# Patient Record
Sex: Male | Born: 1950 | Race: Black or African American | Hispanic: No | Marital: Married | Smoking: Never smoker
Health system: Southern US, Community
[De-identification: ages and names within clinical notes are randomized; demographics above are authoritative.]

## PROBLEM LIST (undated history)

## (undated) DIAGNOSIS — Z86010 Personal history of colonic polyps: Secondary | ICD-10-CM

## (undated) DIAGNOSIS — J309 Allergic rhinitis, unspecified: Secondary | ICD-10-CM

## (undated) DIAGNOSIS — K219 Gastro-esophageal reflux disease without esophagitis: Secondary | ICD-10-CM

## (undated) DIAGNOSIS — L509 Urticaria, unspecified: Secondary | ICD-10-CM

## (undated) DIAGNOSIS — R21 Rash and other nonspecific skin eruption: Secondary | ICD-10-CM

## (undated) DIAGNOSIS — R011 Cardiac murmur, unspecified: Secondary | ICD-10-CM

## (undated) DIAGNOSIS — Z6836 Body mass index (BMI) 36.0-36.9, adult: Secondary | ICD-10-CM

## (undated) DIAGNOSIS — E669 Obesity, unspecified: Secondary | ICD-10-CM

## (undated) DIAGNOSIS — T783XXA Angioneurotic edema, initial encounter: Secondary | ICD-10-CM

## (undated) DIAGNOSIS — R439 Unspecified disturbances of smell and taste: Secondary | ICD-10-CM

## (undated) DIAGNOSIS — I10 Essential (primary) hypertension: Secondary | ICD-10-CM

## (undated) DIAGNOSIS — Z87442 Personal history of urinary calculi: Secondary | ICD-10-CM

## (undated) HISTORY — DX: Rash and other nonspecific skin eruption: R21

## (undated) HISTORY — DX: Angioneurotic edema, initial encounter: T78.3XXA

## (undated) HISTORY — DX: Unspecified disturbances of smell and taste: R43.9

## (undated) HISTORY — DX: Body mass index (BMI) 36.0-36.9, adult: Z68.36

## (undated) HISTORY — DX: Cardiac murmur, unspecified: R01.1

## (undated) HISTORY — DX: Gastro-esophageal reflux disease without esophagitis: K21.9

## (undated) HISTORY — DX: Allergic rhinitis, unspecified: J30.9

## (undated) HISTORY — DX: Urticaria, unspecified: L50.9

## (undated) HISTORY — DX: Obesity, unspecified: E66.9

## (undated) HISTORY — DX: Personal history of colonic polyps: Z86.010

---

## 2013-11-07 ENCOUNTER — Other Ambulatory Visit (HOSPITAL_COMMUNITY): Payer: Self-pay | Admitting: Urology

## 2013-11-07 DIAGNOSIS — N5089 Other specified disorders of the male genital organs: Secondary | ICD-10-CM

## 2013-11-21 ENCOUNTER — Ambulatory Visit (HOSPITAL_COMMUNITY)
Admission: RE | Admit: 2013-11-21 | Discharge: 2013-11-21 | Disposition: A | Discharge status: Home / Self Care | Source: Ambulatory Visit | Attending: Urology | Admitting: Urology

## 2013-11-21 DIAGNOSIS — N508 Other specified disorders of male genital organs: Secondary | ICD-10-CM | POA: Insufficient documentation

## 2013-11-21 DIAGNOSIS — N5089 Other specified disorders of the male genital organs: Secondary | ICD-10-CM

## 2013-11-21 DIAGNOSIS — N433 Hydrocele, unspecified: Secondary | ICD-10-CM | POA: Insufficient documentation

## 2013-12-12 ENCOUNTER — Emergency Department (HOSPITAL_COMMUNITY)

## 2013-12-12 ENCOUNTER — Encounter (HOSPITAL_COMMUNITY): Payer: Self-pay | Admitting: Emergency Medicine

## 2013-12-12 ENCOUNTER — Emergency Department (HOSPITAL_COMMUNITY)
Admission: EM | Admit: 2013-12-12 | Discharge: 2013-12-12 | Disposition: A | Discharge status: Home / Self Care | Attending: Emergency Medicine | Admitting: Emergency Medicine

## 2013-12-12 DIAGNOSIS — G479 Sleep disorder, unspecified: Secondary | ICD-10-CM | POA: Diagnosis not present

## 2013-12-12 DIAGNOSIS — Z7982 Long term (current) use of aspirin: Secondary | ICD-10-CM | POA: Diagnosis not present

## 2013-12-12 DIAGNOSIS — R52 Pain, unspecified: Secondary | ICD-10-CM | POA: Insufficient documentation

## 2013-12-12 DIAGNOSIS — M79609 Pain in unspecified limb: Secondary | ICD-10-CM | POA: Insufficient documentation

## 2013-12-12 DIAGNOSIS — M25519 Pain in unspecified shoulder: Secondary | ICD-10-CM | POA: Diagnosis not present

## 2013-12-12 DIAGNOSIS — Z79899 Other long term (current) drug therapy: Secondary | ICD-10-CM | POA: Insufficient documentation

## 2013-12-12 DIAGNOSIS — M25512 Pain in left shoulder: Secondary | ICD-10-CM

## 2013-12-12 DIAGNOSIS — I10 Essential (primary) hypertension: Secondary | ICD-10-CM | POA: Insufficient documentation

## 2013-12-12 HISTORY — DX: Essential (primary) hypertension: I10

## 2013-12-12 MED ORDER — OXYCODONE-ACETAMINOPHEN 5-325 MG PO TABS: 1.0000 | ORAL_TABLET | Freq: Four times a day (QID) | ORAL | Status: DC | PRN

## 2013-12-12 MED ORDER — OXYCODONE-ACETAMINOPHEN 5-325 MG PO TABS
1.0000 | ORAL_TABLET | Freq: Once | ORAL | Status: AC
  Administered 2013-12-12: 1 via ORAL
  Filled 2013-12-12: qty 1

## 2013-12-12 NOTE — ED Notes (Signed)
MD at bedside. 

## 2013-12-12 NOTE — Discharge Instructions (Signed)
Return to the ED with any concerns including chest pain, shortness of breath, swelling of arm, weakness of arm, or any other alarming symptoms

## 2013-12-12 NOTE — ED Provider Notes (Signed)
CSN: [id number]     Arrival date & time 12/12/13  0137 History   First MD Initiated Contact with Patient 12/12/13 [phone]     Chief Complaint  Patient presents with  . Arm Pain     (Consider location/radiation/quality/duration/timing/severity/associated sxs/prior Treatment) HPI Pt presents with c/o pain in left shoulder.  He states pain is worse with movement and palpation and was causing him difficulty sleeping tonight.  Pt states the pain has been present for 3 days.  No significant injury or trauma.  Pt states he tried aspirin which did not help with pain.  Pain is worse with lifting arm over head.  No swelling.  No weakness of arm.  No neck pain.  No chest pain or shortness of breath.  There are no other associated systemic symptoms, there are no other alleviating or modifying factors.   Past Medical History  Diagnosis Date  . Hypertension    History reviewed. No pertinent past surgical history. History reviewed. No pertinent family history. History  Substance Use Topics  . Smoking status: Never Smoker   . Smokeless tobacco: Not on file  . Alcohol Use: No    Review of Systems ROS reviewed and all otherwise negative except for mentioned in HPI    Allergies  Contrast media  Home Medications   Prior to Admission medications   Medication Sig Start Date End Date Taking? Authorizing Provider  aspirin 325 MG tablet Take 325 mg by mouth every 4 (four) hours as needed for moderate pain.   Yes Historical Provider, MD  aspirin EC 81 MG tablet Take 81 mg by mouth daily.   Yes Historical Provider, MD  atenolol (TENORMIN) 50 MG tablet Take 50 mg by mouth daily.   Yes Historical Provider, MD  esomeprazole (NEXIUM) 20 MG capsule Take 20 mg by mouth daily at 12 noon.   Yes Historical Provider, MD  glucosamine-chondroitin 500-400 MG tablet Take 1 tablet by mouth 3 (three) times daily.   Yes Historical Provider, MD  lisinopril (PRINIVIL,ZESTRIL) 20 MG tablet Take 20 mg by mouth daily.   Yes  Historical Provider, MD  tadalafil (CIALIS) 5 MG tablet Take 5 mg by mouth daily as needed for erectile dysfunction.   Yes Historical Provider, MD  oxyCODONE-acetaminophen (PERCOCET/ROXICET) 5-325 MG per tablet Take 1-2 tablets by mouth every 6 (six) hours as needed for severe pain. 12/12/13   Threasa Beards, MD   BP 132/78  Pulse 76  Temp(Src) 97.7 F (36.5 C) (Oral)  Resp 16  SpO2 100% Vitals reviewed Physical Exam Physical Examination: General appearance - alert, well appearing, and in no distress Mental status - alert, oriented to person, place, and time Eyes - no conjunctival injection, no scleral icterus Mouth - mucous membranes moist, pharynx normal without lesions Neck - supple, no significant adenopathy Chest - clear to auscultation, no wheezes, rales or rhonchi, symmetric air entry Heart - normal rate, regular rhythm, normal S1, S2, no murmurs, rubs, clicks or gallops Musculoskeletal - no bony point tenderness, but pain in left shoulder with raising arm above 90 degrees, also pain with rotation of left shoulder, no joint tenderness, deformity or swelling Extremities - peripheral pulses normal, no pedal edema, no clubbing or cyanosis Skin - normal coloration and turgor, no rashes  ED Course  Procedures (including critical care time) Labs Review Labs Reviewed - No data to display  Imaging Review Dg Shoulder Left  12/12/2013   CLINICAL DATA:  Arm pain for several days. Limited movement. No known  injury.  EXAM: LEFT SHOULDER - 2+ VIEW  COMPARISON:  None.  FINDINGS: There is no evidence of fracture or dislocation. There is no evidence of arthropathy or other focal bone abnormality. Soft tissues are unremarkable.  IMPRESSION: Negative.   Electronically Signed   By: Lucienne Capers M.D.   On: 12/12/2013 04:08     EKG Interpretation   Date/Time:  Monday December 12 2013 04:07:19 EDT Ventricular Rate:  81 PR Interval:  190 QRS Duration: 98 QT Interval:  383 QTC Calculation:  445 R Axis:   -6 Text Interpretation:  Sinus rhythm Abnormal R-wave progression, early  transition Left ventricular hypertrophy No old tracing to compare  Confirmed by [facility]  MD, [place] [phone]) on 12/12/2013 4:12:01 AM      MDM   Final diagnoses:  Shoulder pain, acute, left    Pt presenting with c/o left shoulder pain which has been ongoing for 3 days.  Pain is musculoskeletal in nature- worse with movement and palpation.  EKG without any acute changes- done as a screening, although doubt ACS due to no chest discomfort and muscular nature of pain on exam.  Pt also states that he had a stress test several months ago.  Discharged with strict return precautions.  Pt agreeable with plan.    Threasa Beards, MD 12/12/13 [phone]

## 2013-12-12 NOTE — ED Notes (Signed)
Patient transported to X-ray 

## 2013-12-12 NOTE — ED Notes (Signed)
Pt arrived to the ED with a complaint of left arm pain. Pt states that he has had the pain for three days.  Today the pain has not subsided and is unrelieved by taking asprin.  Pt has full range of motion but it is painful to do so.

## 2015-02-07 DIAGNOSIS — N138 Other obstructive and reflux uropathy: Secondary | ICD-10-CM | POA: Diagnosis not present

## 2015-02-07 DIAGNOSIS — N401 Enlarged prostate with lower urinary tract symptoms: Secondary | ICD-10-CM | POA: Diagnosis not present

## 2015-02-07 DIAGNOSIS — N5201 Erectile dysfunction due to arterial insufficiency: Secondary | ICD-10-CM | POA: Diagnosis not present

## 2015-02-07 DIAGNOSIS — R3912 Poor urinary stream: Secondary | ICD-10-CM | POA: Diagnosis not present

## 2015-05-24 DIAGNOSIS — I10 Essential (primary) hypertension: Secondary | ICD-10-CM | POA: Diagnosis not present

## 2015-05-24 DIAGNOSIS — Z0000 Encounter for general adult medical examination without abnormal findings: Secondary | ICD-10-CM | POA: Diagnosis not present

## 2015-05-24 DIAGNOSIS — Z23 Encounter for immunization: Secondary | ICD-10-CM | POA: Diagnosis not present

## 2015-05-24 DIAGNOSIS — Z136 Encounter for screening for cardiovascular disorders: Secondary | ICD-10-CM | POA: Diagnosis not present

## 2015-05-24 DIAGNOSIS — K219 Gastro-esophageal reflux disease without esophagitis: Secondary | ICD-10-CM | POA: Diagnosis not present

## 2015-05-24 DIAGNOSIS — Z125 Encounter for screening for malignant neoplasm of prostate: Secondary | ICD-10-CM | POA: Diagnosis not present

## 2015-05-24 DIAGNOSIS — Z13220 Encounter for screening for lipoid disorders: Secondary | ICD-10-CM | POA: Diagnosis not present

## 2015-05-24 DIAGNOSIS — K625 Hemorrhage of anus and rectum: Secondary | ICD-10-CM | POA: Diagnosis not present

## 2015-06-07 DIAGNOSIS — K625 Hemorrhage of anus and rectum: Secondary | ICD-10-CM | POA: Diagnosis not present

## 2015-06-07 DIAGNOSIS — K219 Gastro-esophageal reflux disease without esophagitis: Secondary | ICD-10-CM | POA: Diagnosis not present

## 2016-03-19 DIAGNOSIS — K648 Other hemorrhoids: Secondary | ICD-10-CM | POA: Diagnosis not present

## 2016-03-19 DIAGNOSIS — K635 Polyp of colon: Secondary | ICD-10-CM | POA: Diagnosis not present

## 2016-03-19 DIAGNOSIS — D123 Benign neoplasm of transverse colon: Secondary | ICD-10-CM | POA: Diagnosis not present

## 2016-03-19 DIAGNOSIS — K5730 Diverticulosis of large intestine without perforation or abscess without bleeding: Secondary | ICD-10-CM | POA: Diagnosis not present

## 2016-03-19 DIAGNOSIS — D120 Benign neoplasm of cecum: Secondary | ICD-10-CM | POA: Diagnosis not present

## 2016-03-19 DIAGNOSIS — D126 Benign neoplasm of colon, unspecified: Secondary | ICD-10-CM | POA: Diagnosis not present

## 2016-03-19 DIAGNOSIS — D125 Benign neoplasm of sigmoid colon: Secondary | ICD-10-CM | POA: Diagnosis not present

## 2016-03-19 DIAGNOSIS — R12 Heartburn: Secondary | ICD-10-CM | POA: Diagnosis not present

## 2016-03-19 DIAGNOSIS — K625 Hemorrhage of anus and rectum: Secondary | ICD-10-CM | POA: Diagnosis not present

## 2016-03-19 DIAGNOSIS — K2930 Chronic superficial gastritis without bleeding: Secondary | ICD-10-CM | POA: Diagnosis not present

## 2016-03-19 DIAGNOSIS — K3189 Other diseases of stomach and duodenum: Secondary | ICD-10-CM | POA: Diagnosis not present

## 2016-03-25 DIAGNOSIS — K635 Polyp of colon: Secondary | ICD-10-CM | POA: Diagnosis not present

## 2016-03-25 DIAGNOSIS — D126 Benign neoplasm of colon, unspecified: Secondary | ICD-10-CM | POA: Diagnosis not present

## 2016-03-25 DIAGNOSIS — K2930 Chronic superficial gastritis without bleeding: Secondary | ICD-10-CM | POA: Diagnosis not present

## 2016-04-16 DIAGNOSIS — R3912 Poor urinary stream: Secondary | ICD-10-CM | POA: Diagnosis not present

## 2016-04-16 DIAGNOSIS — N5201 Erectile dysfunction due to arterial insufficiency: Secondary | ICD-10-CM | POA: Diagnosis not present

## 2016-04-16 DIAGNOSIS — N401 Enlarged prostate with lower urinary tract symptoms: Secondary | ICD-10-CM | POA: Diagnosis not present

## 2016-06-09 DIAGNOSIS — R252 Cramp and spasm: Secondary | ICD-10-CM | POA: Diagnosis not present

## 2016-06-09 DIAGNOSIS — Z0000 Encounter for general adult medical examination without abnormal findings: Secondary | ICD-10-CM | POA: Diagnosis not present

## 2016-06-09 DIAGNOSIS — Z23 Encounter for immunization: Secondary | ICD-10-CM | POA: Diagnosis not present

## 2016-06-09 DIAGNOSIS — R21 Rash and other nonspecific skin eruption: Secondary | ICD-10-CM | POA: Diagnosis not present

## 2016-06-09 DIAGNOSIS — I10 Essential (primary) hypertension: Secondary | ICD-10-CM | POA: Diagnosis not present

## 2016-06-09 DIAGNOSIS — K219 Gastro-esophageal reflux disease without esophagitis: Secondary | ICD-10-CM | POA: Diagnosis not present

## 2016-06-09 DIAGNOSIS — Z6837 Body mass index (BMI) 37.0-37.9, adult: Secondary | ICD-10-CM | POA: Diagnosis not present

## 2017-03-21 DIAGNOSIS — R51 Headache: Secondary | ICD-10-CM | POA: Diagnosis not present

## 2017-03-21 DIAGNOSIS — M25511 Pain in right shoulder: Secondary | ICD-10-CM | POA: Diagnosis not present

## 2017-06-01 ENCOUNTER — Emergency Department (HOSPITAL_COMMUNITY)
Admission: EM | Admit: 2017-06-01 | Discharge: 2017-06-01 | Disposition: A | Discharge status: Home / Self Care | Payer: Medicare Other | Attending: Emergency Medicine | Admitting: Emergency Medicine

## 2017-06-01 ENCOUNTER — Encounter (HOSPITAL_COMMUNITY): Payer: Self-pay | Admitting: Emergency Medicine

## 2017-06-01 ENCOUNTER — Emergency Department (HOSPITAL_COMMUNITY): Payer: Medicare Other

## 2017-06-01 DIAGNOSIS — R112 Nausea with vomiting, unspecified: Secondary | ICD-10-CM | POA: Diagnosis not present

## 2017-06-01 DIAGNOSIS — N201 Calculus of ureter: Secondary | ICD-10-CM | POA: Diagnosis not present

## 2017-06-01 DIAGNOSIS — R1030 Lower abdominal pain, unspecified: Secondary | ICD-10-CM | POA: Diagnosis not present

## 2017-06-01 DIAGNOSIS — I10 Essential (primary) hypertension: Secondary | ICD-10-CM | POA: Insufficient documentation

## 2017-06-01 DIAGNOSIS — Z79899 Other long term (current) drug therapy: Secondary | ICD-10-CM | POA: Diagnosis not present

## 2017-06-01 DIAGNOSIS — N132 Hydronephrosis with renal and ureteral calculous obstruction: Secondary | ICD-10-CM | POA: Diagnosis not present

## 2017-06-01 LAB — COMPREHENSIVE METABOLIC PANEL
ALBUMIN: 4 g/dL (ref 3.5–5.0)
ALT: 29 U/L (ref 17–63)
AST: 23 U/L (ref 15–41)
Alkaline Phosphatase: 62 U/L (ref 38–126)
Anion gap: 9 (ref 5–15)
BILIRUBIN TOTAL: 0.7 mg/dL (ref 0.3–1.2)
BUN: 11 mg/dL (ref 6–20)
CO2: 23 mmol/L (ref 22–32)
Calcium: 8.8 mg/dL — ABNORMAL LOW (ref 8.9–10.3)
Chloride: 107 mmol/L (ref 101–111)
Creatinine, Ser: 0.89 mg/dL (ref 0.61–1.24)
GFR calc Af Amer: >60 mL/min (ref >60)
GFR calc non Af Amer: >60 mL/min (ref >60)
GLUCOSE: 139 mg/dL — AB (ref 65–99)
POTASSIUM: 3.9 mmol/L (ref 3.5–5.1)
Sodium: 139 mmol/L (ref 135–145)
Total Protein: 7.4 g/dL (ref 6.5–8.1)

## 2017-06-01 LAB — I-STAT CG4 LACTIC ACID, ED
Lactic Acid, Venous: 3.4 mmol/L (ref 0.5–1.9)
Lactic Acid, Venous: 2.11 mmol/L (ref 0.5–1.9)

## 2017-06-01 LAB — URINALYSIS, ROUTINE W REFLEX MICROSCOPIC
Bilirubin Urine: NEGATIVE
Glucose, UA: NEGATIVE mg/dL
Ketones, ur: NEGATIVE mg/dL
Leukocytes, UA: NEGATIVE
NITRITE: NEGATIVE
Protein, ur: 30 mg/dL — AB
SQUAMOUS EPITHELIAL / LPF: NONE SEEN
Specific Gravity, Urine: 1.021 (ref 1.005–1.030)
pH: 5 (ref 5.0–8.0)

## 2017-06-01 LAB — CBC
HEMATOCRIT: 40.3 % (ref 39.0–52.0)
Hemoglobin: 13.3 g/dL (ref 13.0–17.0)
MCH: 29.9 pg (ref 26.0–34.0)
MCHC: 33 g/dL (ref 30.0–36.0)
MCV: 90.6 fL (ref 78.0–100.0)
Platelets: 242 10*3/uL (ref 150–400)
RBC: 4.45 MIL/uL (ref 4.22–5.81)
RDW: 13.6 % (ref 11.5–15.5)
WBC: 6.3 10*3/uL (ref 4.0–10.5)

## 2017-06-01 LAB — LIPASE, BLOOD: Lipase: 21 U/L (ref 11–51)

## 2017-06-01 MED ORDER — KETOROLAC TROMETHAMINE 30 MG/ML IJ SOLN: 30.0000 mg | Freq: Once | INTRAMUSCULAR | Status: DC

## 2017-06-01 MED ORDER — OXYCODONE-ACETAMINOPHEN 5-325 MG PO TABS
1.0000 | ORAL_TABLET | Freq: Four times a day (QID) | ORAL | 0 refills | Status: DC | PRN
Start: 2017-06-01 — End: 2017-08-26

## 2017-06-01 MED ORDER — MORPHINE SULFATE (PF) 4 MG/ML IV SOLN: 4.0000 mg | Freq: Once | INTRAVENOUS | Status: DC

## 2017-06-01 MED ORDER — SODIUM CHLORIDE 0.9 % IV BOLUS (SEPSIS)
1000.0000 mL | Freq: Once | INTRAVENOUS | Status: AC
  Administered 2017-06-01: 1000 mL via INTRAVENOUS

## 2017-06-01 MED ORDER — ONDANSETRON HCL 4 MG/2ML IJ SOLN
4.0000 mg | Freq: Once | INTRAMUSCULAR | Status: AC
  Administered 2017-06-01: 4 mg via INTRAVENOUS
  Filled 2017-06-01: qty 2

## 2017-06-01 MED ORDER — ONDANSETRON 4 MG PO TBDP
4.0000 mg | ORAL_TABLET | Freq: Once | ORAL | Status: AC | PRN
  Administered 2017-06-01: 4 mg via ORAL
  Filled 2017-06-01: qty 1

## 2017-06-01 MED ORDER — PROMETHAZINE HCL 25 MG PO TABS: 25.0000 mg | ORAL_TABLET | Freq: Four times a day (QID) | ORAL | 0 refills | Status: DC | PRN

## 2017-06-01 NOTE — ED Notes (Signed)
Pt cannot use restroom at this time, aware urine specimen is needed.  

## 2017-06-01 NOTE — ED Notes (Signed)
RN stated to Probation officer that she would still be obtaining I-Stat lactic on patient

## 2017-06-01 NOTE — ED Notes (Signed)
Rn stated to Probation officer that she would obtain I-Stat lactic

## 2017-06-01 NOTE — ED Triage Notes (Signed)
Patient reports while driving down road he started having n/v. Denies any diarrhea.

## 2017-06-01 NOTE — Discharge Instructions (Signed)
Follow-up with urology as scheduled.  Return here for any worsening in your condition.  Increase your fluid intake

## 2017-06-01 NOTE — ED Provider Notes (Signed)
[place] DEPT Provider Note   CSN: [id number] Arrival date & time: 06/01/17  1136     History   Chief Complaint Chief Complaint  Patient presents with  . Emesis    HPI Mitchell Pennington is a 67 y.o. male.  HPI Patient presents to the emergency department with abdominal discomfort in the lower abdomen and nausea and vomiting that started acutely this morning.  The patient states that he did not take any medications prior to arrival for symptoms.  The patient states that nothing seems to make the condition better or worse.  The patient denies chest pain, shortness of breath, headache,blurred vision, neck pain, fever, cough, weakness, numbness, dizziness, anorexia, edema,diarrhea, rash, back pain, dysuria, hematemesis, bloody stool, near syncope, or syncope. Past Medical History:  Diagnosis Date  . Hypertension     There are no active problems to display for this patient.   History reviewed. No pertinent surgical history.     Home Medications    Prior to Admission medications   Medication Sig Start Date End Date Taking? Authorizing Provider  aspirin EC 81 MG tablet Take 81 mg by mouth daily.   Yes [provider]  atenolol (TENORMIN) 50 MG tablet Take 50 mg by mouth daily.   Yes [provider]  lisinopril (PRINIVIL,ZESTRIL) 20 MG tablet Take 20 mg by mouth daily.   Yes [provider]  pantoprazole (PROTONIX) 40 MG tablet Take 40 mg by mouth daily. 05/05/17  Yes [provider]  tadalafil (CIALIS) 5 MG tablet Take 5 mg by mouth daily as needed for erectile dysfunction.   Yes [provider]  triamcinolone cream (KENALOG) 0.1 % Apply 1 application topically daily as needed. 04/18/17  Yes [provider]  oxyCODONE-acetaminophen (PERCOCET/ROXICET) 5-325 MG per tablet Take 1-2 tablets by mouth every 6 (six) hours as needed for severe pain. Patient not taking: Reported on 06/01/2017 12/12/13    Pixie Casino, MD    Family History No family history on file.  Social History Social History   Tobacco Use  . Smoking status: Never Smoker  . Smokeless tobacco: Never Used  Substance Use Topics  . Alcohol use: No  . Drug use: No     Allergies   Contrast media [iodinated diagnostic agents]   Review of Systems Review of Systems  All other systems negative except as documented in the HPI. All pertinent positives and negatives as reviewed in the HPI. Physical Exam Updated Vital Signs BP (!) 156/88   Pulse (!) 58   Resp 20   SpO2 98%   Physical Exam  Constitutional: He is oriented to person, place, and time. He appears well-developed and well-nourished. No distress.  HENT:  Head: Normocephalic and atraumatic.  Mouth/Throat: Oropharynx is clear and moist.  Eyes: Pupils are equal, round, and reactive to light.  Neck: Normal range of motion. Neck supple.  Cardiovascular: Normal rate, regular rhythm and normal heart sounds. Exam reveals no gallop and no friction rub.  No murmur heard. Pulmonary/Chest: Effort normal and breath sounds normal. No respiratory distress. He has no wheezes.  Abdominal: Soft. Bowel sounds are normal. He exhibits no distension and no mass. There is tenderness. There is no rebound and no guarding.  Neurological: He is alert and oriented to person, place, and time. He exhibits normal muscle tone. Coordination normal.  Skin: Skin is warm and dry. Capillary refill takes less than 2 seconds. No rash noted. No erythema.  Psychiatric: He has a normal  mood and affect. His behavior is normal.  Nursing note and vitals reviewed.    ED Treatments / Results  Labs (all labs ordered are listed, but only abnormal results are displayed) Labs Reviewed  COMPREHENSIVE METABOLIC PANEL - Abnormal; Notable for the following components:      Result Value   Glucose, Bld 139 (*)    Calcium 8.8 (*)    All other components within normal limits  URINALYSIS, ROUTINE W  REFLEX MICROSCOPIC - Abnormal; Notable for the following components:   APPearance HAZY (*)    Hgb urine dipstick LARGE (*)    Protein, ur 30 (*)    Bacteria, UA RARE (*)    All other components within normal limits  I-STAT CG4 LACTIC ACID, ED - Abnormal; Notable for the following components:   Lactic Acid, Venous 3.40 (*)    All other components within normal limits  I-STAT CG4 LACTIC ACID, ED - Abnormal; Notable for the following components:   Lactic Acid, Venous 2.11 (*)    All other components within normal limits  LIPASE, BLOOD  CBC    EKG  EKG Interpretation None       Radiology Ct Abdomen Pelvis Wo Contrast  Result Date: 06/01/2017 CLINICAL DATA:  67 y/o  M; nausea and vomiting. EXAM: CT ABDOMEN AND PELVIS WITHOUT CONTRAST TECHNIQUE: Multidetector CT imaging of the abdomen and pelvis was performed following the standard protocol without IV contrast. COMPARISON:  None. FINDINGS: Lower chest: 4 mm right middle lobe juxtapleural nodule (series 4, image 20). Mild gynecomastia. Hepatobiliary: No focal liver abnormality is seen. No gallstones, gallbladder wall thickening, or biliary dilatation. Pancreas: Unremarkable. No pancreatic ductal dilatation or surrounding inflammatory changes. Spleen: Normal in size without focal abnormality. Adrenals/Urinary Tract: Bilateral renal stones measuring up to 7 mm in the right kidney lower pole and punctate in left interpolar kidney. Right kidney lower pole cyst measuring 39 mm. Mild right hydronephrosis with 4 mm stone in the right distal ureter approximately 5-6 cm upstream to the ureterovesicular junction. Normal left ureter. Normal bladder. Normal adrenal glands. Stomach/Bowel: Stomach is within normal limits. Appendix not identified. No evidence of bowel wall thickening, distention, or inflammatory changes. Vascular/Lymphatic: No significant vascular findings are present. No enlarged abdominal or pelvic lymph nodes. Reproductive: Prostate measures  4.2 x 5.1 x 4.3 cm (volume = 48 cm^3). Other: Small paraumbilical hernia containing fat. Musculoskeletal: No fracture is seen. Mild-to-moderate lumbar spondylosis with prominent lower lumbar facet arthrosis. IMPRESSION: 1. Mild right hydronephrosis with 4 mm stone in distal right ureter approximately 5-6 cm upstream to ureterovesicular junction. 2. Bilateral nonobstructing kidney stones. 3. 4 mm right middle lobe pulmonary nodule. No follow-up needed if patient is low-risk. Non-contrast chest CT can be considered in 12 months if patient is high-risk. This recommendation follows the consensus statement: Guidelines for Management of Incidental Pulmonary Nodules Detected on CT Images: From the Fleischner Society 2017; Radiology 2017; 284:228-243. Electronically Signed   By: Kristine Garbe M.D.   On: 06/01/2017 22:21    Procedures Procedures (including critical care time)  Medications Ordered in ED Medications  morphine 4 MG/ML injection 4 mg (not administered)  ketorolac (TORADOL) 30 MG/ML injection 30 mg (not administered)  ondansetron (ZOFRAN-ODT) disintegrating tablet 4 mg (4 mg Oral Given 06/01/17 1155)  sodium chloride 0.9 % bolus 1,000 mL (1,000 mLs Intravenous New Bag/Given 06/01/17 2006)  ondansetron (ZOFRAN) injection 4 mg (4 mg Intravenous Given 06/01/17 2006)     Initial Impression / Assessment and Plan / ED  Course  I have reviewed the triage vital signs and the nursing notes.  Pertinent labs & imaging results that were available during my care of the patient were reviewed by me and considered in my medical decision making (see chart for details).   Patient is given a liter of fluid here in the emergency department and is feeling no pain at this time.  Patient does have a 4 mm right ureteral stone.  He does have an appointment with a urologist on Thursday for separate issue.  She states he passed a kidney stone probably 30-40 years ago.  Patient is advised to return here for any  worsening in his condition patient agrees the plan and all questions were answered.     Final Clinical Impressions(s) / ED Diagnoses   Final diagnoses:  None    ED Discharge Orders    None       Dalia Heading, PA-C 06/01/17 2257    Gareth Morgan, MD 06/02/17 1214

## 2017-06-04 DIAGNOSIS — N5201 Erectile dysfunction due to arterial insufficiency: Secondary | ICD-10-CM | POA: Diagnosis not present

## 2017-06-04 DIAGNOSIS — N201 Calculus of ureter: Secondary | ICD-10-CM | POA: Diagnosis not present

## 2017-06-04 DIAGNOSIS — N401 Enlarged prostate with lower urinary tract symptoms: Secondary | ICD-10-CM | POA: Diagnosis not present

## 2017-06-04 DIAGNOSIS — R3912 Poor urinary stream: Secondary | ICD-10-CM | POA: Diagnosis not present

## 2017-06-17 DIAGNOSIS — R21 Rash and other nonspecific skin eruption: Secondary | ICD-10-CM | POA: Diagnosis not present

## 2017-06-17 DIAGNOSIS — K219 Gastro-esophageal reflux disease without esophagitis: Secondary | ICD-10-CM | POA: Diagnosis not present

## 2017-06-17 DIAGNOSIS — Z0000 Encounter for general adult medical examination without abnormal findings: Secondary | ICD-10-CM | POA: Diagnosis not present

## 2017-06-17 DIAGNOSIS — I10 Essential (primary) hypertension: Secondary | ICD-10-CM | POA: Diagnosis not present

## 2017-06-18 DIAGNOSIS — N202 Calculus of kidney with calculus of ureter: Secondary | ICD-10-CM | POA: Diagnosis not present

## 2017-07-16 DIAGNOSIS — N202 Calculus of kidney with calculus of ureter: Secondary | ICD-10-CM | POA: Diagnosis not present

## 2017-07-16 DIAGNOSIS — Z125 Encounter for screening for malignant neoplasm of prostate: Secondary | ICD-10-CM | POA: Diagnosis not present

## 2017-07-16 DIAGNOSIS — N281 Cyst of kidney, acquired: Secondary | ICD-10-CM | POA: Diagnosis not present

## 2017-08-14 DIAGNOSIS — R21 Rash and other nonspecific skin eruption: Secondary | ICD-10-CM | POA: Diagnosis not present

## 2017-08-14 DIAGNOSIS — I10 Essential (primary) hypertension: Secondary | ICD-10-CM | POA: Diagnosis not present

## 2017-08-14 DIAGNOSIS — Z0000 Encounter for general adult medical examination without abnormal findings: Secondary | ICD-10-CM | POA: Diagnosis not present

## 2017-08-14 DIAGNOSIS — K219 Gastro-esophageal reflux disease without esophagitis: Secondary | ICD-10-CM | POA: Diagnosis not present

## 2017-08-23 ENCOUNTER — Emergency Department (HOSPITAL_COMMUNITY)
Admission: EM | Admit: 2017-08-23 | Discharge: 2017-08-23 | Disposition: A | Discharge status: Home / Self Care | Payer: Medicare Other | Attending: Emergency Medicine | Admitting: Emergency Medicine

## 2017-08-23 ENCOUNTER — Encounter (HOSPITAL_COMMUNITY): Payer: Self-pay | Admitting: Emergency Medicine

## 2017-08-23 DIAGNOSIS — I10 Essential (primary) hypertension: Secondary | ICD-10-CM | POA: Insufficient documentation

## 2017-08-23 DIAGNOSIS — Z79899 Other long term (current) drug therapy: Secondary | ICD-10-CM | POA: Insufficient documentation

## 2017-08-23 DIAGNOSIS — T783XXA Angioneurotic edema, initial encounter: Secondary | ICD-10-CM | POA: Diagnosis not present

## 2017-08-23 DIAGNOSIS — R21 Rash and other nonspecific skin eruption: Secondary | ICD-10-CM | POA: Diagnosis not present

## 2017-08-23 MED ORDER — SODIUM CHLORIDE 0.9 % IV BOLUS
1000.0000 mL | Freq: Once | INTRAVENOUS | Status: AC
  Administered 2017-08-23: 1000 mL via INTRAVENOUS

## 2017-08-23 MED ORDER — METHYLPREDNISOLONE SODIUM SUCC 125 MG IJ SOLR
125.0000 mg | Freq: Once | INTRAMUSCULAR | Status: AC
  Administered 2017-08-23: 125 mg via INTRAVENOUS
  Filled 2017-08-23: qty 2

## 2017-08-23 MED ORDER — FAMOTIDINE IN NACL 20-0.9 MG/50ML-% IV SOLN
20.0000 mg | Freq: Once | INTRAVENOUS | Status: AC
  Administered 2017-08-23: 20 mg via INTRAVENOUS
  Filled 2017-08-23: qty 50

## 2017-08-23 MED ORDER — DIPHENHYDRAMINE HCL 50 MG/ML IJ SOLN
25.0000 mg | Freq: Once | INTRAMUSCULAR | Status: AC
  Administered 2017-08-23: 25 mg via INTRAVENOUS
  Filled 2017-08-23: qty 1

## 2017-08-23 MED ORDER — EPINEPHRINE 0.3 MG/0.3ML IJ SOAJ
0.3000 mg | Freq: Once | INTRAMUSCULAR | Status: AC
  Administered 2017-08-23: 0.3 mg via INTRAMUSCULAR
  Filled 2017-08-23: qty 0.3

## 2017-08-23 NOTE — ED Notes (Signed)
Pt given sprite, tolerating well

## 2017-08-23 NOTE — ED Triage Notes (Signed)
Patient presents to ED for assessment for hives x 3 days, and then waking up to lip swelling this morning.  Hx of Lisinopril

## 2017-08-23 NOTE — ED Provider Notes (Signed)
[facility] EMERGENCY DEPARTMENT Provider Note   CSN: [id number] Arrival date & time: 08/23/17  1236     History   Chief Complaint Chief Complaint  Patient presents with  . Angioedema    HPI Mitchell Pennington is a 67 y.o. male history of hypertension who presents for evaluation of lip swelling that began this morning and generalized urticaria that began over the last 2 days.  Patient states that he initially started having some generalized urticaria noted to his upper extremities.  He states that over the last 2 days, it is spread to his bilateral lower extremities, abdomen, chest and back.  He states that the area is pruritic but not painful.  Patient states that he had one episode of vomiting yesterday.  But has not had any nausea or vomiting since.  He states that he has taken Benadryl with slight improvement in symptoms.  Patient reports that he woke up today and noted that his lower lip was swollen and that he had some swelling on the right upper lip.  He states he is not having any tongue swelling or throat closing sensation.  He is still able to tolerate his secretions and p.o.  He denies any difficulty breathing.  Wife does report that since being in the ED, the lip swelling has improved slightly.  Patient reports he is on lisinopril 20 mg daily.  His last dose was this morning prior to onset of symptoms.  He has never had an issue with this medication previously.  Patient states that he has not had any new exposures prior to onset of symptoms.  He denies any new lotions, detergents, soaps, insect bites, foods.  He has not eaten anything abnormal.  Patient denies any chest pain, difficulty breathing, normal pain, numbness/weakness of his extremities.  The history is provided by the patient.    Past Medical History:  Diagnosis Date  . Hypertension     There are no active problems to display for this patient.   History reviewed. No pertinent surgical  history.      Home Medications    Prior to Admission medications   Medication Sig Start Date End Date Taking? Authorizing Provider  aspirin EC 81 MG tablet Take 81 mg by mouth daily.   Yes [provider]  aspirin-acetaminophen-caffeine (EXCEDRIN MIGRAINE) [phone] MG tablet Take 1 tablet by mouth every 6 (six) hours as needed for headache.   Yes [provider]  atenolol (TENORMIN) 50 MG tablet Take 50 mg by mouth daily.   Yes [provider]  diphenhydramine-acetaminophen (TYLENOL PM) 25-500 MG TABS tablet Take 1 tablet by mouth every 6 (six) hours as needed (hives/itching).   Yes [provider]  hydrocortisone cream 1 % Apply 1 application topically See admin instructions. Apply topically after shaving - approximately 3 times week   Yes [provider]  montelukast (SINGULAIR) 10 MG tablet Take 10 mg by mouth daily as needed (seasonal allergies).   Yes [provider]  neomycin-bacitracin-polymyxin (NEOSPORIN) OINT Apply 1 application topically See admin instructions. Apply topically after shaving - approximately three times week   Yes [provider]  pantoprazole (PROTONIX) 40 MG tablet Take 40 mg by mouth daily. 05/05/17  Yes [provider]  tadalafil (CIALIS) 5 MG tablet Take 5 mg by mouth at bedtime.    Yes [provider]  triamcinolone cream (KENALOG) 0.1 % Apply 1 application topically 2 (two) times daily as needed (rash/itching).  04/18/17  Yes [provider]  vitamin B-12 (CYANOCOBALAMIN) 1000 MCG tablet Take 2,000 mcg by mouth daily.   Yes [provider]  lisinopril (PRINIVIL,ZESTRIL) 20 MG tablet Take 20 mg by mouth daily.    [provider]  oxyCODONE-acetaminophen (PERCOCET/ROXICET) 5-325 MG tablet Take 1 tablet by mouth every 6 (six) hours as needed for severe pain. Patient not taking: Reported on 08/23/2017 06/01/17   Dalia Heading, PA-C  promethazine  (PHENERGAN) 25 MG tablet Take 1 tablet (25 mg total) by mouth every 6 (six) hours as needed for nausea or vomiting. Patient not taking: Reported on 08/23/2017 06/01/17   Dalia Heading, PA-C    Family History History reviewed. No pertinent family history.  Social History Social History   Tobacco Use  . Smoking status: Never Smoker  . Smokeless tobacco: Never Used  Substance Use Topics  . Alcohol use: No  . Drug use: No     Allergies   Contrast media [iodinated diagnostic agents] and Lisinopril   Review of Systems Review of Systems  Constitutional: Negative for fever.  HENT: Positive for facial swelling. Negative for drooling, sore throat and trouble swallowing.   Respiratory: Negative for shortness of breath.   Cardiovascular: Negative for chest pain.  Gastrointestinal: Negative for abdominal pain, nausea and vomiting.  Skin: Positive for rash.  Neurological: Negative for headaches.  All other systems reviewed and are negative.    Physical Exam Updated Vital Signs BP 104/68   Pulse 77   Temp 99.1 F (37.3 C) (Oral)   Resp (!) 24   SpO2 98%   Physical Exam  Constitutional: He is oriented to person, place, and time. He appears well-developed and well-nourished.  HENT:  Head: Normocephalic and atraumatic.  Mouth/Throat: Oropharynx is clear and moist and mucous membranes are normal. Normal dentition. No dental abscesses.  Airway is patent, phonation is intact.  Lower lip is swollen.  There is some light swelling of the right upper lip.  No tongue swelling.  No gingival erythema, edema.  No evidence of dental abscess.  Posterior oropharynx is clear.  Eyes: Pupils are equal, round, and reactive to light. Conjunctivae, EOM and lids are normal.  Neck: Full passive range of motion without pain.  No neck swelling.  No crepitus noted.  Cardiovascular: Normal rate, regular rhythm, normal heart sounds and normal pulses. Exam reveals no gallop and no friction rub.  No  murmur heard. Pulmonary/Chest: Effort normal and breath sounds normal. No stridor. He has no wheezes.  Lungs clear to auscultation bilaterally.  Symmetric chest rise.  No wheezing, rales, rhonchi.  Abdominal: Soft. Normal appearance. There is no tenderness. There is no rigidity and no guarding.  Musculoskeletal: Normal range of motion.  Neurological: He is alert and oriented to person, place, and time.  Skin: Skin is warm and dry. Capillary refill takes less than 2 seconds. Rash noted. Rash is urticarial.  Diffuse urticaria noted to chest, abdomen, bilateral upper extremities, bilateral lower extremities, back.  Rash noted on palms or soles.  Psychiatric: He has a normal mood and affect. His speech is normal.  Nursing note and vitals reviewed.    ED Treatments / Results  Labs (all labs ordered are listed, but only abnormal results are displayed) Labs Reviewed - No data to display  EKG None  Radiology No results found.  Procedures Procedures (including critical care time)  Medications Ordered in ED Medications  sodium chloride 0.9 % bolus 1,000 mL (0 mLs Intravenous Stopped 08/23/17 1549)  methylPREDNISolone sodium succinate (SOLU-MEDROL)  125 mg/2 mL injection 125 mg (125 mg Intravenous Given 08/23/17 1322)  diphenhydrAMINE (BENADRYL) injection 25 mg (25 mg Intravenous Given 08/23/17 1320)  famotidine (PEPCID) IVPB 20 mg premix (0 mg Intravenous Stopped 08/23/17 1356)  sodium chloride 0.9 % bolus 1,000 mL (0 mLs Intravenous Stopped 08/23/17 1756)  EPINEPHrine (EPI-PEN) injection 0.3 mg (0.3 mg Intramuscular Given 08/23/17 1633)     Initial Impression / Assessment and Plan / ED Course  I have reviewed the triage vital signs and the nursing notes.  Pertinent labs & imaging results that were available during my care of the patient were reviewed by me and considered in my medical decision making (see chart for details).  Clinical Course as of Aug 23 2100  Sun Aug 23, 8573  3458  67 year old male here with 3 days of diffuse urticaria with unclear trigger.  He states when he woke up this morning his upper and lower lips were very swollen.  He has been able to swallow easily and it did not affect his voice.  He states the swelling is actually improved since this morning.  He is taken nothing for it.  He is on an ACE inhibitor for last 20 years.  Clinically he is in no distress and so we are treating him with modifications for anaphylactoid reaction.  If his lip swelling gets any worse consider FFP.   [MB]    Clinical Course User Index [MB] Hayden Rasmussen, MD    67 year old male with past no history of hypertension who presents for evaluation of lip swelling that began today and generalized urticaria that has been going on for last 2 days.  Reports he is on lisinopril.  Denies any other exposures.  Has been taking Benadryl with minimal improvement of his rash.  Does state that since being in the ED lip swelling has improved.  No chest pain, difficulty breathing, difficulty tolerating p.o. Patient is afebrile, non-toxic appearing, sitting comfortably on examination table. Vital signs reviewed and stable.  On exam, he does have some lower lip swelling and right-sided upper lip swelling.  He also has generalized urticaria to his body.  Airway is patent, phonation is intact.  History/physical exam is not concerning for Ludwig angina or peritonsillar abscess.  SPECT that this may be a reaction to lisinopril but given the generalized urticaria, consider allergic reaction.  Plan for Benadryl, IV Pepcid, Solu-Medrol, fluids and reassess.  Will hold on epinephrine at this time and unclear trigger.  Reevaluation after meds.  Rash has resolved.  He still has slight lip swelling.  He denies any difficulty breathing.  We will plan to p.o. Challenge..  Patient's blood pressure started dropping into 80s over 50s.  He has not been eating today and might contribute to symptoms.  We will give an  additional liter of fluids and reassess.  Patient blood pressure still soft.  He still having swelling of his lips that has not worsened.  Given that he now has hypotension, will plan for EpiPen given in the department.  We will observe him until 830 which is 4 hours status post epi for any rebound effect.  Reevaluation.  Patient has some improvement in lip swelling.  He is tolerating p.o. in the department without any difficulty.  Vital signs are stable.  Reevaluation after 4 hours of giving epi.  Upper lip swelling has resolved.  He still has some mild lip swelling on the lower but is improved significantly since giving the EpiPen.  Vital signs are  stable.  He has been tolerating p.o. in the department without any difficulty.  No evidence of respiratory distress.  No complaints of difficulty breathing, vomiting.  Patient is stable for discharge at this time.  Instructed him not to take anymore lisinopril and to follow-up with his primary care doctor tomorrow to get started on a new blood pressure medication.  Instructed him to closely monitor his symptoms and return to the ED for any worsening or concerning symptoms. Patient had ample opportunity for questions and discussion. All patient's questions were answered with full understanding. Strict return precautions discussed. Patient expresses understanding and agreement to plan.   Final Clinical Impressions(s) / ED Diagnoses   Final diagnoses:  Angioedema, initial encounter  Rash    ED Discharge Orders    None       Desma Mcgregor 08/23/17 2102    Hayden Rasmussen, MD 08/24/17 1152

## 2017-08-23 NOTE — Discharge Instructions (Signed)
As we discussed, do not take anymore lisinopril.  You need to follow-up with your primary care doctor tomorrow to be put on a new blood pressure medication.  Monitor your symptoms closely.  If you feel like you are having any difficulty breathing, difficulty swallowing, return of any lip swelling, tongue swelling, you need to return to the emergency department immediately.

## 2017-08-25 ENCOUNTER — Observation Stay (HOSPITAL_COMMUNITY)
Admission: EM | Admit: 2017-08-25 | Discharge: 2017-08-26 | Disposition: A | Discharge status: Home / Self Care | Payer: Medicare Other | Attending: Internal Medicine | Admitting: Internal Medicine

## 2017-08-25 ENCOUNTER — Other Ambulatory Visit: Payer: Self-pay

## 2017-08-25 ENCOUNTER — Encounter (HOSPITAL_COMMUNITY): Payer: Self-pay | Admitting: Emergency Medicine

## 2017-08-25 DIAGNOSIS — I10 Essential (primary) hypertension: Secondary | ICD-10-CM | POA: Diagnosis not present

## 2017-08-25 DIAGNOSIS — Z79899 Other long term (current) drug therapy: Secondary | ICD-10-CM | POA: Insufficient documentation

## 2017-08-25 DIAGNOSIS — T7840XA Allergy, unspecified, initial encounter: Secondary | ICD-10-CM

## 2017-08-25 DIAGNOSIS — Z7982 Long term (current) use of aspirin: Secondary | ICD-10-CM | POA: Diagnosis not present

## 2017-08-25 DIAGNOSIS — R220 Localized swelling, mass and lump, head: Secondary | ICD-10-CM | POA: Diagnosis not present

## 2017-08-25 DIAGNOSIS — T464X5A Adverse effect of angiotensin-converting-enzyme inhibitors, initial encounter: Secondary | ICD-10-CM

## 2017-08-25 DIAGNOSIS — X58XXXA Exposure to other specified factors, initial encounter: Secondary | ICD-10-CM | POA: Diagnosis not present

## 2017-08-25 DIAGNOSIS — T783XXA Angioneurotic edema, initial encounter: Principal | ICD-10-CM | POA: Insufficient documentation

## 2017-08-25 DIAGNOSIS — K219 Gastro-esophageal reflux disease without esophagitis: Secondary | ICD-10-CM

## 2017-08-25 LAB — BASIC METABOLIC PANEL WITH GFR
Anion gap: 8 (ref 5–15)
BUN: 20 mg/dL (ref 6–20)
CO2: 23 mmol/L (ref 22–32)
Calcium: 8.4 mg/dL — ABNORMAL LOW (ref 8.9–10.3)
Chloride: 107 mmol/L (ref 101–111)
Creatinine, Ser: 0.88 mg/dL (ref 0.61–1.24)
GFR calc Af Amer: >60 mL/min
GFR calc non Af Amer: >60 mL/min
Glucose, Bld: 112 mg/dL — ABNORMAL HIGH (ref 65–99)
Potassium: 3.6 mmol/L (ref 3.5–5.1)
Sodium: 138 mmol/L (ref 135–145)

## 2017-08-25 LAB — CBC WITH DIFFERENTIAL/PLATELET
Abs Immature Granulocytes: 0.1 K/uL (ref 0.0–0.1)
Basophils Absolute: 0 K/uL (ref 0.0–0.1)
Basophils Relative: 0 %
Eosinophils Absolute: 0 K/uL (ref 0.0–0.7)
Eosinophils Relative: 0 %
HCT: 40.3 % (ref 39.0–52.0)
Hemoglobin: 13.5 g/dL (ref 13.0–17.0)
Immature Granulocytes: 1 %
Lymphocytes Relative: 25 %
Lymphs Abs: 2 K/uL (ref 0.7–4.0)
MCH: 29.6 pg (ref 26.0–34.0)
MCHC: 33.5 g/dL (ref 30.0–36.0)
MCV: 88.4 fL (ref 78.0–100.0)
Monocytes Absolute: 0.2 K/uL (ref 0.1–1.0)
Monocytes Relative: 3 %
Neutro Abs: 5.7 K/uL (ref 1.7–7.7)
Neutrophils Relative %: 71 %
Platelets: 247 K/uL (ref 150–400)
RBC: 4.56 MIL/uL (ref 4.22–5.81)
RDW: 13.2 % (ref 11.5–15.5)
WBC: 8 K/uL (ref 4.0–10.5)

## 2017-08-25 MED ORDER — METHYLPREDNISOLONE SODIUM SUCC 125 MG IJ SOLR
125.0000 mg | Freq: Once | INTRAMUSCULAR | Status: AC
  Administered 2017-08-25: 125 mg via INTRAVENOUS
  Filled 2017-08-25: qty 2

## 2017-08-25 MED ORDER — EPINEPHRINE 0.3 MG/0.3ML IJ SOAJ
0.3000 mg | Freq: Once | INTRAMUSCULAR | Status: AC
  Administered 2017-08-25: 0.3 mg via INTRAMUSCULAR
  Filled 2017-08-25: qty 0.3

## 2017-08-25 MED ORDER — ACETAMINOPHEN 325 MG PO TABS: 650.0000 mg | ORAL_TABLET | Freq: Four times a day (QID) | ORAL | Status: DC | PRN

## 2017-08-25 MED ORDER — SODIUM CHLORIDE 0.9 % IV SOLN
INTRAVENOUS | Status: DC
  Administered 2017-08-25: 14:00:00 via INTRAVENOUS

## 2017-08-25 MED ORDER — FAMOTIDINE IN NACL 20-0.9 MG/50ML-% IV SOLN
20.0000 mg | Freq: Once | INTRAVENOUS | Status: AC
  Administered 2017-08-25: 20 mg via INTRAVENOUS
  Filled 2017-08-25: qty 50

## 2017-08-25 MED ORDER — BISACODYL 10 MG RE SUPP: 10.0000 mg | Freq: Every day | RECTAL | Status: DC | PRN

## 2017-08-25 MED ORDER — EPINEPHRINE 0.3 MG/0.3ML IJ SOAJ
0.3000 mg | Freq: Four times a day (QID) | INTRAMUSCULAR | Status: DC | PRN
  Filled 2017-08-25: qty 0.3

## 2017-08-25 MED ORDER — SENNOSIDES-DOCUSATE SODIUM 8.6-50 MG PO TABS
1.0000 | ORAL_TABLET | Freq: Every evening | ORAL | Status: DC | PRN
Start: 2017-08-25 — End: 2017-08-26

## 2017-08-25 MED ORDER — METHYLPREDNISOLONE SODIUM SUCC 125 MG IJ SOLR
60.0000 mg | Freq: Two times a day (BID) | INTRAMUSCULAR | Status: DC
  Administered 2017-08-25 – 2017-08-26 (×2): 60 mg via INTRAVENOUS
  Filled 2017-08-25 (×2): qty 2

## 2017-08-25 MED ORDER — DIPHENHYDRAMINE HCL 50 MG/ML IJ SOLN: 50.0000 mg | Freq: Four times a day (QID) | INTRAMUSCULAR | Status: DC | PRN

## 2017-08-25 MED ORDER — ENOXAPARIN SODIUM 40 MG/0.4ML ~~LOC~~ SOLN
40.0000 mg | SUBCUTANEOUS | Status: DC
  Administered 2017-08-25: 40 mg via SUBCUTANEOUS
  Filled 2017-08-25 (×2): qty 0.4

## 2017-08-25 MED ORDER — FAMOTIDINE IN NACL 20-0.9 MG/50ML-% IV SOLN
20.0000 mg | Freq: Two times a day (BID) | INTRAVENOUS | Status: DC
  Administered 2017-08-25 – 2017-08-26 (×2): 20 mg via INTRAVENOUS
  Filled 2017-08-25 (×2): qty 50

## 2017-08-25 MED ORDER — ACETAMINOPHEN 650 MG RE SUPP: 650.0000 mg | Freq: Four times a day (QID) | RECTAL | Status: DC | PRN

## 2017-08-25 MED ORDER — DIPHENHYDRAMINE HCL 50 MG/ML IJ SOLN
50.0000 mg | Freq: Once | INTRAMUSCULAR | Status: AC
  Administered 2017-08-25: 50 mg via INTRAVENOUS
  Filled 2017-08-25: qty 1

## 2017-08-25 MED ORDER — KETOROLAC TROMETHAMINE 15 MG/ML IJ SOLN: 15.0000 mg | Freq: Four times a day (QID) | INTRAMUSCULAR | Status: DC | PRN

## 2017-08-25 MED ORDER — MONTELUKAST SODIUM 10 MG PO TABS: 10.0000 mg | ORAL_TABLET | Freq: Every day | ORAL | Status: DC | PRN

## 2017-08-25 MED ORDER — ONDANSETRON HCL 4 MG PO TABS: 4.0000 mg | ORAL_TABLET | Freq: Four times a day (QID) | ORAL | Status: DC | PRN

## 2017-08-25 MED ORDER — IPRATROPIUM-ALBUTEROL 0.5-2.5 (3) MG/3ML IN SOLN: 3.0000 mL | Freq: Four times a day (QID) | RESPIRATORY_TRACT | Status: DC | PRN

## 2017-08-25 MED ORDER — HYDRALAZINE HCL 20 MG/ML IJ SOLN: 5.0000 mg | Freq: Three times a day (TID) | INTRAMUSCULAR | Status: DC | PRN

## 2017-08-25 MED ORDER — HEPARIN SODIUM (PORCINE) 5000 UNIT/ML IJ SOLN: 5000.0000 [IU] | Freq: Three times a day (TID) | INTRAMUSCULAR | Status: DC

## 2017-08-25 MED ORDER — SODIUM CHLORIDE 0.9 % IV BOLUS
500.0000 mL | Freq: Once | INTRAVENOUS | Status: AC
  Administered 2017-08-25: 500 mL via INTRAVENOUS

## 2017-08-25 MED ORDER — ONDANSETRON HCL 4 MG/2ML IJ SOLN: 4.0000 mg | Freq: Four times a day (QID) | INTRAMUSCULAR | Status: DC | PRN

## 2017-08-25 NOTE — ED Provider Notes (Signed)
Patient is a 67 year old male who is had 2 ER visits in the last 48 hours for facial swelling.  Initially thought to be angioedema due to his lisinopril.  Patient went home did not take his lisinopril, however had worsening swelling, feeling that his throat was closing and swelling to his lips and face.  Signed out from Dr. Betsey Holiday that we should follow-up and to recheck at 4 to 6 hours.  I rechecked this morning and he is not really improved.  Patient still has a significant amount of swelling.  His throat does feel improved.  So would not dose epi currently.  However will dose Benadryl again.  And consider re-dosing steroids in 1 to 2 hours as they are due.  Given that this is a biphasic allergic reaction versus worsening angioedema, do not feel comfortable discharging at this time.   Macarthur Critchley, MD 08/25/17 1045

## 2017-08-25 NOTE — ED Provider Notes (Signed)
[facility] EMERGENCY DEPARTMENT Provider Note   CSN: [id number] Arrival date & time: 08/25/17  0450     History   Chief Complaint Chief Complaint  Patient presents with  . Shortness of Breath    HPI Mitchell Pennington is a 67 y.o. male.  Patient presents to the ER for evaluation of lip swelling and throat discomfort.  Patient reports that he was seen 2 days ago with an allergic reaction.  At that time he had diffuse urticaria and facial swelling, predominantly his lips.  It was felt that this was secondary to his lisinopril which was stopped.  He is now experiencing pain in his throat when he swallows, swelling around his eyes and slight swelling of the lips.  No shortness of breath or chest pain.  He does not have any rash.     Past Medical History:  Diagnosis Date  . Hypertension     There are no active problems to display for this patient.   History reviewed. No pertinent surgical history.      Home Medications    Prior to Admission medications   Medication Sig Start Date End Date Taking? Authorizing Provider  aspirin EC 81 MG tablet Take 81 mg by mouth daily.   Yes [provider]  aspirin-acetaminophen-caffeine (EXCEDRIN MIGRAINE) [phone] MG tablet Take 1 tablet by mouth every 6 (six) hours as needed for headache.   Yes [provider]  atenolol (TENORMIN) 50 MG tablet Take 50 mg by mouth daily.   Yes [provider]  diphenhydramine-acetaminophen (TYLENOL PM) 25-500 MG TABS tablet Take 1 tablet by mouth every 6 (six) hours as needed (hives/itching).   Yes [provider]  hydrocortisone cream 1 % Apply 1 application topically See admin instructions. Apply topically after shaving - approximately 3 times week   Yes [provider]  montelukast (SINGULAIR) 10 MG tablet Take 10 mg by mouth daily as needed (seasonal allergies).   Yes [provider]  neomycin-bacitracin-polymyxin (NEOSPORIN)  OINT Apply 1 application topically See admin instructions. Apply topically after shaving - approximately three times week   Yes [provider]  pantoprazole (PROTONIX) 40 MG tablet Take 40 mg by mouth daily. 05/05/17  Yes [provider]  tadalafil (CIALIS) 5 MG tablet Take 5 mg by mouth at bedtime.    Yes [provider]  triamcinolone cream (KENALOG) 0.1 % Apply 1 application topically 2 (two) times daily as needed (rash/itching).  04/18/17  Yes [provider]  vitamin B-12 (CYANOCOBALAMIN) 1000 MCG tablet Take 2,000 mcg by mouth daily.   Yes [provider]  irbesartan (AVAPRO) 150 MG tablet Take 150 mg by mouth daily.    [provider]  oxyCODONE-acetaminophen (PERCOCET/ROXICET) 5-325 MG tablet Take 1 tablet by mouth every 6 (six) hours as needed for severe pain. Patient not taking: Reported on 08/23/2017 06/01/17   Dalia Heading, PA-C  promethazine (PHENERGAN) 25 MG tablet Take 1 tablet (25 mg total) by mouth every 6 (six) hours as needed for nausea or vomiting. Patient not taking: Reported on 08/23/2017 06/01/17   Dalia Heading, PA-C    Family History No family history on file.  Social History Social History   Tobacco Use  . Smoking status: Never Smoker  . Smokeless tobacco: Never Used  Substance Use Topics  . Alcohol use: No  . Drug use: No     Allergies   Contrast media [iodinated diagnostic agents] and Lisinopril   Review of Systems Review of  Systems  HENT: Positive for facial swelling, sore throat and trouble swallowing.   Skin: Negative for rash.  All other systems reviewed and are negative.    Physical Exam Updated Vital Signs BP 123/75   Pulse 73   Temp 98.6 F (37 C) (Oral)   Resp (!) 33   SpO2 97%   Physical Exam  Constitutional: He is oriented to person, place, and time. He appears well-developed and well-nourished. No distress.  HENT:  Head: Normocephalic and atraumatic.  Right Ear:  Hearing normal.  Left Ear: Hearing normal.  Nose: Nose normal.  Mouth/Throat: Mucous membranes are normal. Posterior oropharyngeal edema and posterior oropharyngeal erythema present.  Eyes: Pupils are equal, round, and reactive to light. Conjunctivae and EOM are normal.  Periorbital edema, right greater than left  Neck: Normal range of motion. Neck supple.  Cardiovascular: Regular rhythm, S1 normal and S2 normal. Exam reveals no gallop and no friction rub.  No murmur heard. Pulmonary/Chest: Effort normal and breath sounds normal. No respiratory distress. He exhibits no tenderness.  Abdominal: Soft. Normal appearance and bowel sounds are normal. There is no hepatosplenomegaly. There is no tenderness. There is no rebound, no guarding, no tenderness at McBurney's point and negative Murphy's sign. No hernia.  Musculoskeletal: Normal range of motion.  Neurological: He is alert and oriented to person, place, and time. He has normal strength. No cranial nerve deficit or sensory deficit. Coordination normal. GCS eye subscore is 4. GCS verbal subscore is 5. GCS motor subscore is 6.  Skin: Skin is warm, dry and intact. No rash noted. No cyanosis.  Psychiatric: He has a normal mood and affect. His speech is normal and behavior is normal. Thought content normal.  Nursing note and vitals reviewed.    ED Treatments / Results  Labs (all labs ordered are listed, but only abnormal results are displayed) Labs Reviewed  BASIC METABOLIC PANEL - Abnormal; Notable for the following components:      Result Value   Glucose, Bld 112 (*)    Calcium 8.4 (*)    All other components within normal limits  CBC WITH DIFFERENTIAL/PLATELET    EKG None  Radiology No results found.  Procedures Procedures (including critical care time)  Medications Ordered in ED Medications  sodium chloride 0.9 % bolus 500 mL (500 mLs Intravenous New Bag/Given 08/25/17 0546)  diphenhydrAMINE (BENADRYL) injection 50 mg (50 mg  Intravenous Given 08/25/17 0545)  methylPREDNISolone sodium succinate (SOLU-MEDROL) 125 mg/2 mL injection 125 mg (125 mg Intravenous Given 08/25/17 0545)  famotidine (PEPCID) IVPB 20 mg premix (0 mg Intravenous Stopped 08/25/17 0625)     Initial Impression / Assessment and Plan / ED Course  I have reviewed the triage vital signs and the nursing notes.  Pertinent labs & imaging results that were available during my care of the patient were reviewed by me and considered in my medical decision making (see chart for details).     Patient presents to the emergency department for evaluation of allergic reaction.  Patient seen 2 days ago with rash and facial swelling, etiology felt to be secondary to lisinopril.  Lisinopril was stopped.  Patient has recurrence of symptoms, though less severe.  Minimal angioedema, but does have some symptoms of feeling like his throat is swelling.  He does have moderate periorbital edema.  No rash.  Patient administered IV Benadryl, Pepcid, Solu-Medrol.  He reports that he is improving.  Will sign out to oncoming ER physician to monitor improvement, will need to discharge  with prednisone.  Final Clinical Impressions(s) / ED Diagnoses   Final diagnoses:  Allergic reaction, initial encounter    ED Discharge Orders    None       Estefany Goebel, Gwenyth Allegra, MD 08/25/17 [phone]

## 2017-08-25 NOTE — ED Triage Notes (Signed)
Patient here with shortness of breath, states he was seen recently for a lisinopril reaction, he is feeling swelling in his face, lips and having a hard time swallowing at this time.  He states that he has some swelling to his hands also.

## 2017-08-25 NOTE — ED Notes (Signed)
ED Provider at bedside. 

## 2017-08-25 NOTE — H&P (Addendum)
History and Physical    Mitchell Pennington BPZ:[id number] DOB: [birth year] DOA: 08/25/2017   PCP: Lawerance Cruel, MD   Patient coming from:  Home    Chief Complaint: Eyes and lip swelling  HPI: Mitchell Pennington is a 67 y.o. male with medical history significant for HTN on ACE I until recently, seen at the ER 2 times over the last 48 hours for facial swelling.  Initially, was thought to be angioedema due to lisinopril, which was discontinued, and at the ER treated with steroids and epinephrine.  Upon discharge from the ER, the patient was instructed to follow-up and recheck the following day, but no improvement was seen.  He still has significant amount of facial swelling, does receiving Benadryl again, and Solu-Medrol 125 mg x 1.  Prior to this event, the patient did have some urticaria after shaving and mild urticaria on his right arm, coincident dull with a change in detergent.  He is not aware of allergies to any other foods, or places.  He denies any shortness of breath, or chest pain or palpitations.  At this time he does not have any rashes.  He does report periorbital edema, but no vision changes.  He denies any other areas of swelling in the legs or arms.  He reports being able to swallow better, although his speech still limited due to tongue and lip edema.  No confusion is reported.  No tobacco, alcohol or recreational drug use  ED Course:  BP 134/84   Pulse 78   Temp 98.6 F (37 C) (Oral)   Resp 17   SpO2 97%   Chemistries and CBC are normal.   Received EpiPen, IV Solu-Medrol 125 mg, Benadryl as needed, as well as Pepcid. Will be admitted for continuation of care, in view of unresolved angioedema.  Review of Systems:  As per HPI otherwise all other systems reviewed and are negative  Past Medical History:  Diagnosis Date  . Hypertension     History reviewed. No pertinent surgical history.  Social History Social History   Socioeconomic History  . Marital status:  Widowed    Spouse name: Not on file  . Number of children: Not on file  . Years of education: Not on file  . Highest education level: Not on file  Occupational History  . Not on file  Social Needs  . Financial resource strain: Not on file  . Food insecurity:    Worry: Not on file    Inability: Not on file  . Transportation needs:    Medical: Not on file    Non-medical: Not on file  Tobacco Use  . Smoking status: Never Smoker  . Smokeless tobacco: Never Used  Substance and Sexual Activity  . Alcohol use: No  . Drug use: No  . Sexual activity: Never  Lifestyle  . Physical activity:    Days per week: Not on file    Minutes per session: Not on file  . Stress: Not on file  Relationships  . Social connections:    Talks on phone: Not on file    Gets together: Not on file    Attends religious service: Not on file    Active member of club or organization: Not on file    Attends meetings of clubs or organizations: Not on file    Relationship status: Not on file  . Intimate partner violence:    Fear of current or ex partner: Not on file    Emotionally abused: Not  on file    Physically abused: Not on file    Forced sexual activity: Not on file  Other Topics Concern  . Not on file  Social History Narrative  . Not on file     Allergies  Allergen Reactions  . Contrast Media [Iodinated Diagnostic Agents] Shortness Of Breath  . Lisinopril Swelling    Lip swelling    History reviewed. No pertinent family history.    Prior to Admission medications   Medication Sig Start Date End Date Taking? Authorizing Provider  aspirin EC 81 MG tablet Take 81 mg by mouth daily.   Yes [provider]  aspirin-acetaminophen-caffeine (EXCEDRIN MIGRAINE) [phone] MG tablet Take 1 tablet by mouth every 6 (six) hours as needed for headache.   Yes [provider]  atenolol (TENORMIN) 50 MG tablet Take 50 mg by mouth daily.   Yes [provider]    diphenhydramine-acetaminophen (TYLENOL PM) 25-500 MG TABS tablet Take 1 tablet by mouth every 6 (six) hours as needed (hives/itching).   Yes [provider]  hydrocortisone cream 1 % Apply 1 application topically See admin instructions. Apply topically after shaving - approximately 3 times week   Yes [provider]  montelukast (SINGULAIR) 10 MG tablet Take 10 mg by mouth daily as needed (seasonal allergies).   Yes [provider]  neomycin-bacitracin-polymyxin (NEOSPORIN) OINT Apply 1 application topically See admin instructions. Apply topically after shaving - approximately three times week   Yes [provider]  pantoprazole (PROTONIX) 40 MG tablet Take 40 mg by mouth daily. 05/05/17  Yes [provider]  tadalafil (CIALIS) 5 MG tablet Take 5 mg by mouth at bedtime.    Yes [provider]  triamcinolone cream (KENALOG) 0.1 % Apply 1 application topically 2 (two) times daily as needed (rash/itching).  04/18/17  Yes [provider]  vitamin B-12 (CYANOCOBALAMIN) 1000 MCG tablet Take 2,000 mcg by mouth daily.   Yes [provider]  irbesartan (AVAPRO) 150 MG tablet Take 150 mg by mouth daily.    [provider]  oxyCODONE-acetaminophen (PERCOCET/ROXICET) 5-325 MG tablet Take 1 tablet by mouth every 6 (six) hours as needed for severe pain. Patient not taking: Reported on 08/23/2017 06/01/17   Dalia Heading, PA-C  promethazine (PHENERGAN) 25 MG tablet Take 1 tablet (25 mg total) by mouth every 6 (six) hours as needed for nausea or vomiting. Patient not taking: Reported on 08/23/2017 06/01/17   Dalia Heading, PA-C    Physical Exam:  Vitals:   08/25/17 0845 08/25/17 0900 08/25/17 0915 08/25/17 0945  BP: 113/78 125/78 130/77 134/84  Pulse: 71 74 66 78  Resp: (!) 23 (!) 26 (!) 25 17  Temp:      TempSrc:      SpO2: 97% 94% 97% 97%   Constitutional: NAD, calm, comfortable at this time  eyes: PERRL,   conjunctivae normal.  Periorbital edema right greater than left is noted. ENMT: Mucous membranes are moist, without exudate or lesions, posterior oropharyngeal edema is noted.  No erythema. Neck: normal, supple, no masses, no thyromegaly Respiratory: clear to auscultation bilaterally, no wheezing, no crackles. Normal respiratory effort  Cardiovascular: Regular rate and rhythm,  murmur, rubs or gallops. No extremity edema. 2+ pedal pulses. No carotid bruits.  Abdomen: Soft, non tender, No hepatosplenomegaly. Bowel sounds positive.  Musculoskeletal: no clubbing / cyanosis. Moves all extremities Skin: no jaundice, No lesions.  Neurologic: Sensation intact  Strength equal in all extremities Psychiatric:   Alert and oriented  x 3. Normal mood.   Labs on Admission: I have personally reviewed following labs and imaging studies  CBC: Recent Labs  Lab 08/25/17 0548  WBC 8.0  NEUTROABS 5.7  HGB 13.5  HCT 40.3  MCV 88.4  PLT 846    Basic Metabolic Panel: Recent Labs  Lab 08/25/17 0548  NA 138  K 3.6  CL 107  CO2 23  GLUCOSE 112*  BUN 20  CREATININE 0.88  CALCIUM 8.4*    GFR: CrCl cannot be calculated (Unknown ideal weight.).  Liver Function Tests: No results for input(s): AST, ALT, ALKPHOS, BILITOT, PROT, ALBUMIN in the last 168 hours. No results for input(s): LIPASE, AMYLASE in the last 168 hours. No results for input(s): AMMONIA in the last 168 hours.  Coagulation Profile: No results for input(s): INR, PROTIME in the last 168 hours.  Cardiac Enzymes: No results for input(s): CKTOTAL, CKMB, CKMBINDEX, TROPONINI in the last 168 hours.  BNP (last 3 results) No results for input(s): PROBNP in the last 8760 hours.  HbA1C: No results for input(s): HGBA1C in the last 72 hours.  CBG: No results for input(s): GLUCAP in the last 168 hours.  Lipid Profile: No results for input(s): CHOL, HDL, LDLCALC, TRIG, CHOLHDL, LDLDIRECT in the last 72 hours.  Thyroid Function  Tests: No results for input(s): TSH, T4TOTAL, FREET4, T3FREE, THYROIDAB in the last 72 hours.  Anemia Panel: No results for input(s): VITAMINB12, FOLATE, FERRITIN, TIBC, IRON, RETICCTPCT in the last 72 hours.  Urine analysis:    Component Value Date/Time   COLORURINE YELLOW 06/01/2017 1922   APPEARANCEUR HAZY (A) 06/01/2017 1922   LABSPEC 1.021 06/01/2017 1922   PHURINE 5.0 06/01/2017 1922   GLUCOSEU NEGATIVE 06/01/2017 1922   HGBUR LARGE (A) 06/01/2017 [place] NEGATIVE 06/01/2017 [place] NEGATIVE 06/01/2017 1922   PROTEINUR 30 (A) 06/01/2017 1922   NITRITE NEGATIVE 06/01/2017 1922   LEUKOCYTESUR NEGATIVE 06/01/2017 1922    Sepsis Labs: @LABRCNTIP (procalcitonin:4,lacticidven:4) )No results found for this or any previous visit (from the past 240 hour(s)).   Radiological Exams on Admission: No results found.  EKG: Independently reviewed.  Assessment/Plan Principal Problem:   Angioedema due to angiotensin converting enzyme inhibitor (ACE-I) Active Problems:   Hypertension   GERD (gastroesophageal reflux disease)   Angioedema, likely due to ACE inhibitor versus environmental versus detergent, versus idiopathic the patient had 2 visits to the ER over the last 48 hours, without significant improvement.  He received Benadryl and Solu-Medrol, airway is preserved.  Vital signs stable, afebrile, white count normal.  Chemistries normal.  She is being admitted for observation and management of symptoms. MedSurgobs Continue Solu-Medrol 60 mg twice daily  Benadryl as needed  Pepcid twice daily  HOld any ACE I or Sartans DuoNeb every 6 hours as needed if the patient develops wheezing N.p.o. for now, the patient will advance as tolerated once able to improve his swallowing Likely the patient to go home within the next 24 hours, with prednisone   Hypertension BP 134/84   Pulse 78   Controlled We will hold oral meds, patient is now allergic to ACE inhibitor. Add  Hydralazine Q6 hours as needed for BP greater than 180 Once able to swallow better, he can resume his atenolol  GERD, no acute symptoms Continue PPI    DVT prophylaxis: Lovenox Code Status:    Full code Family Communication:  Discussed with patient Disposition Plan: Expect patient to be discharged to home after condition improves Consults called:  None Admission status: MedSurg OBs   Sharene Butters, PA-C [facility]   Amion text  [phone]   08/25/2017, 11:21 AM

## 2017-08-25 NOTE — ED Notes (Signed)
Patient ambulatory to bathroom with steady gait at this time 

## 2017-08-25 NOTE — ED Notes (Signed)
Pt ok to have applesauce per MD

## 2017-08-26 DIAGNOSIS — T7840XA Allergy, unspecified, initial encounter: Secondary | ICD-10-CM

## 2017-08-26 DIAGNOSIS — T783XXA Angioneurotic edema, initial encounter: Secondary | ICD-10-CM | POA: Diagnosis not present

## 2017-08-26 DIAGNOSIS — K210 Gastro-esophageal reflux disease with esophagitis: Secondary | ICD-10-CM | POA: Diagnosis not present

## 2017-08-26 DIAGNOSIS — I10 Essential (primary) hypertension: Secondary | ICD-10-CM

## 2017-08-26 DIAGNOSIS — T464X5A Adverse effect of angiotensin-converting-enzyme inhibitors, initial encounter: Secondary | ICD-10-CM | POA: Diagnosis not present

## 2017-08-26 LAB — CBC
HEMATOCRIT: 34 % — AB (ref 39.0–52.0)
Hemoglobin: 11.3 g/dL — ABNORMAL LOW (ref 13.0–17.0)
MCH: 29.4 pg (ref 26.0–34.0)
MCHC: 33.2 g/dL (ref 30.0–36.0)
MCV: 88.3 fL (ref 78.0–100.0)
PLATELETS: 232 10*3/uL (ref 150–400)
RBC: 3.85 MIL/uL — ABNORMAL LOW (ref 4.22–5.81)
RDW: 13.3 % (ref 11.5–15.5)
WBC: 11.5 10*3/uL — ABNORMAL HIGH (ref 4.0–10.5)

## 2017-08-26 LAB — BASIC METABOLIC PANEL
Anion gap: 9 (ref 5–15)
BUN: 20 mg/dL (ref 6–20)
CHLORIDE: 106 mmol/L (ref 101–111)
CO2: 23 mmol/L (ref 22–32)
Calcium: 8.2 mg/dL — ABNORMAL LOW (ref 8.9–10.3)
Creatinine, Ser: 0.77 mg/dL (ref 0.61–1.24)
GFR calc Af Amer: >60 mL/min (ref >60)
GFR calc non Af Amer: >60 mL/min (ref >60)
GLUCOSE: 131 mg/dL — AB (ref 65–99)
POTASSIUM: 4 mmol/L (ref 3.5–5.1)
Sodium: 138 mmol/L (ref 135–145)

## 2017-08-26 MED ORDER — PREDNISONE 10 MG PO TABS: 10.0000 mg | ORAL_TABLET | Freq: Every day | ORAL | 0 refills | Status: DC

## 2017-08-26 MED ORDER — ATENOLOL 25 MG PO TABS: 25.0000 mg | ORAL_TABLET | Freq: Every day | ORAL | 0 refills | Status: DC

## 2017-08-26 NOTE — Discharge Summary (Addendum)
Physician Discharge Summary  Mitchell Pennington XBD:[id number] DOB: 10/02/1950 DOA: 08/25/2017  PCP: Lawerance Cruel, MD  Admit date: 08/25/2017 Discharge date: 08/26/2017  Admitted From: Home Disposition:  Home  Recommendations for Outpatient Follow-up:  1. Follow up with PCP in 1-2 weeks 2. Please titrate BP medications as tolerated     Discharge Condition: Improved CODE STATUS: Full code Diet recommendation: Heart healthy  Brief/Interim Summary: 67 y.o. male with a Past Medical History of HTN who presents with persistent and refractory lip swelling.  He noticed a rash on his chest and back and neck last week that has resolved but then started Sunday AM with lip and tongue swelling.  He was seen in the ER and discharged but his symptoms progressed with some SOB and throat discomfort so he returned  Angioedema, likely due to ACE inhibitor versus environmental versus detergent, versus idiopathic the patient had 2 visits to the ER over the last 48 hours, without significant improvement.  He received Benadryl and Solu-Medrol, airway is preserved.   Vital signs stable, afebrile, white count normal.  Patient was continued on Solu-Medrol 60 mg twice daily  Benadryl as needed  Pepcid twice daily  Patient remained stable overnight with no further recurrence Patient medically stable for hospital discharge with steroid taper as an outpatient ACE inhibitor/ARB has since been listed as an allergen, would avoid in the future  Hypertension BP 134/84   Pulse 78   Blood pressure remained stable this hospital visit Patient note, patient's atenolol was decreased to 25 mg p.o. daily from 50 mg secondary to relative bradycardia overnight.  GERD,  stable  Discharge Diagnoses:  Principal Problem:   Angioedema due to angiotensin converting enzyme inhibitor (ACE-I) Active Problems:   Hypertension   GERD (gastroesophageal reflux disease)    Discharge Instructions   Allergies as of  08/26/2017      Reactions   Angiotensin Receptor Blockers    ACE-induced angioedema   Contrast Media [iodinated Diagnostic Agents] Shortness Of Breath   Lisinopril Anaphylaxis   Angioedema      Medication List    STOP taking these medications   oxyCODONE-acetaminophen 5-325 MG tablet Commonly known as:  PERCOCET/ROXICET   promethazine 25 MG tablet Commonly known as:  PHENERGAN     TAKE these medications   aspirin EC 81 MG tablet Take 81 mg by mouth daily.   aspirin-acetaminophen-caffeine 250-250-65 MG tablet Commonly known as:  EXCEDRIN MIGRAINE Take 1 tablet by mouth every 6 (six) hours as needed for headache.   atenolol 25 MG tablet Commonly known as:  TENORMIN Take 1 tablet (25 mg total) by mouth daily. What changed:    medication strength  how much to take   diphenhydramine-acetaminophen 25-500 MG Tabs tablet Commonly known as:  TYLENOL PM Take 1 tablet by mouth every 6 (six) hours as needed (hives/itching).   hydrocortisone cream 1 % Apply 1 application topically See admin instructions. Apply topically after shaving - approximately 3 times week   montelukast 10 MG tablet Commonly known as:  SINGULAIR Take 10 mg by mouth daily as needed (seasonal allergies).   neomycin-bacitracin-polymyxin Oint Commonly known as:  NEOSPORIN Apply 1 application topically See admin instructions. Apply topically after shaving - approximately three times week   pantoprazole 40 MG tablet Commonly known as:  PROTONIX Take 40 mg by mouth daily.   predniSONE 10 MG tablet Commonly known as:  DELTASONE Take 1 tablet (10 mg total) by mouth daily.   tadalafil 5 MG tablet Commonly known  as:  CIALIS Take 5 mg by mouth at bedtime.   triamcinolone cream 0.1 % Commonly known as:  KENALOG Apply 1 application topically 2 (two) times daily as needed (rash/itching).   vitamin B-12 1000 MCG tablet Commonly known as:  CYANOCOBALAMIN Take 2,000 mcg by mouth daily.      Follow-up  Information    Lawerance Cruel, MD. Schedule an appointment as soon as possible for a visit in 1 week(s).   Specialty:  Family Medicine Contact information: [address] [place] [address] [phone]          Allergies  Allergen Reactions  . Angiotensin Receptor Blockers     ACE-induced angioedema  . Contrast Media [Iodinated Diagnostic Agents] Shortness Of Breath  . Lisinopril Anaphylaxis    Angioedema     Procedures/Studies:  No results found.  Subjective: Very eager to go home today  Discharge Exam: Vitals:   08/25/17 2329 08/26/17 0738  BP: 140/75 136/81  Pulse: (!) 55 (!) 48  Resp: 20 18  Temp: 98.1 F (36.7 C) 97.7 F (36.5 C)  SpO2: 99% 98%   Vitals:   08/25/17 1651 08/25/17 2329 08/26/17 0500 08/26/17 0738  BP: 130/70 140/75  136/81  Pulse: 73 (!) 55  (!) 48  Resp: 18 20  18   Temp: (!) 97.3 F (36.3 C) 98.1 F (36.7 C)  97.7 F (36.5 C)  TempSrc: Oral Oral  Oral  SpO2: 96% 99%  98%  Weight: 121.9 kg (268 lb 11.9 oz)  122.1 kg (269 lb 2.9 oz)   Height: 5\' 11"  (1.803 m)       General: Pt is alert, awake, not in acute distress Cardiovascular: RRR, S1/S2 +, no rubs, no gallops Respiratory: CTA bilaterally, no wheezing, no rhonchi Abdominal: Soft, NT, ND, bowel sounds + Extremities: no edema, no cyanosis   The results of significant diagnostics from this hospitalization (including imaging, microbiology, ancillary and laboratory) are listed below for reference.     Microbiology: No results found for this or any previous visit (from the past 240 hour(s)).   Labs: BNP (last 3 results) No results for input(s): BNP in the last 8760 hours. Basic Metabolic Panel: Recent Labs  Lab 08/25/17 0548 08/26/17 0238  NA 138 138  K 3.6 4.0  CL 107 106  CO2 23 23  GLUCOSE 112* 131*  BUN 20 20  CREATININE 0.88 0.77  CALCIUM 8.4* 8.2*   Liver Function Tests: No results for input(s): AST, ALT, ALKPHOS, BILITOT, PROT, ALBUMIN in the  last 168 hours. No results for input(s): LIPASE, AMYLASE in the last 168 hours. No results for input(s): AMMONIA in the last 168 hours. CBC: Recent Labs  Lab 08/25/17 0548 08/26/17 0238  WBC 8.0 11.5*  NEUTROABS 5.7  --   HGB 13.5 11.3*  HCT 40.3 34.0*  MCV 88.4 88.3  PLT 247 232   Cardiac Enzymes: No results for input(s): CKTOTAL, CKMB, CKMBINDEX, TROPONINI in the last 168 hours. BNP: Invalid input(s): POCBNP CBG: No results for input(s): GLUCAP in the last 168 hours. D-Dimer No results for input(s): DDIMER in the last 72 hours. Hgb A1c No results for input(s): HGBA1C in the last 72 hours. Lipid Profile No results for input(s): CHOL, HDL, LDLCALC, TRIG, CHOLHDL, LDLDIRECT in the last 72 hours. Thyroid function studies No results for input(s): TSH, T4TOTAL, T3FREE, THYROIDAB in the last 72 hours.  Invalid input(s): FREET3 Anemia work up No results for input(s): VITAMINB12, FOLATE, FERRITIN, TIBC, IRON, RETICCTPCT in the  last 72 hours. Urinalysis    Component Value Date/Time   COLORURINE YELLOW 06/01/2017 1922   APPEARANCEUR HAZY (A) 06/01/2017 1922   LABSPEC 1.021 06/01/2017 1922   PHURINE 5.0 06/01/2017 1922   GLUCOSEU NEGATIVE 06/01/2017 1922   HGBUR LARGE (A) 06/01/2017 [place] NEGATIVE 06/01/2017 [place] NEGATIVE 06/01/2017 1922   PROTEINUR 30 (A) 06/01/2017 1922   NITRITE NEGATIVE 06/01/2017 1922   LEUKOCYTESUR NEGATIVE 06/01/2017 1922   Sepsis Labs Invalid input(s): PROCALCITONIN,  WBC,  LACTICIDVEN Microbiology No results found for this or any previous visit (from the past 240 hour(s)).   SIGNED:  Time spent: 30 minutes  Marylu Lund, MD  [facility] 08/26/2017, 10:39 AM  If 7PM-7AM, please contact night-coverage [email or web address] Password [email or web address]

## 2017-08-28 DIAGNOSIS — I10 Essential (primary) hypertension: Secondary | ICD-10-CM | POA: Diagnosis not present

## 2017-08-28 DIAGNOSIS — L509 Urticaria, unspecified: Secondary | ICD-10-CM | POA: Diagnosis not present

## 2017-09-18 DIAGNOSIS — R5383 Other fatigue: Secondary | ICD-10-CM | POA: Diagnosis not present

## 2017-09-18 DIAGNOSIS — I10 Essential (primary) hypertension: Secondary | ICD-10-CM | POA: Diagnosis not present

## 2017-09-21 ENCOUNTER — Ambulatory Visit (INDEPENDENT_AMBULATORY_CARE_PROVIDER_SITE_OTHER): Payer: Medicare Other | Admitting: Allergy & Immunology

## 2017-09-21 ENCOUNTER — Encounter: Payer: Self-pay | Admitting: Allergy & Immunology

## 2017-09-21 VITALS — BP 136/90 | HR 68 | Temp 98.5°F | Resp 24 | Ht 69.0 in | Wt 259.6 lb

## 2017-09-21 DIAGNOSIS — T782XXD Anaphylactic shock, unspecified, subsequent encounter: Secondary | ICD-10-CM

## 2017-09-21 DIAGNOSIS — J302 Other seasonal allergic rhinitis: Secondary | ICD-10-CM | POA: Diagnosis not present

## 2017-09-21 MED ORDER — EPINEPHRINE 0.3 MG/0.3ML IJ SOAJ: 0.3000 mg | Freq: Once | INTRAMUSCULAR | 2 refills | Status: AC

## 2017-09-21 NOTE — Progress Notes (Signed)
NEW PATIENT  Date of Service/Encounter:  09/21/17  Referring provider: Lawerance Cruel, MD   Assessment:   Seasonal allergic rhinitis - symptomatically (environmental IgE panel pending)  Anaphylaxis - unknown trigger   Mitchell Pennington presents following an episode of anaphylaxis. This was initally diagnosed as ACEI induced angioedema. However, the history is not entirely consistent with this, most prominently with the presence of the urticaria, which is notably absent in nearly all cases. We need to rule out some other serious causes of anaphylaxis, including alpha gal sensitivity, which can present in very odd random clinically. We will look for environmental allergies and rule out other serious causes of angioedema and urticaria including mast cell disease amongst others. We did provide a list of food allergies that we can test for, and Mitchell Pennington will bring this back at the next visit for food allergy testing.   Plan/Recommendations:   1. Seasonal allergic rhinitis - We will look for allergies in blood work. - We will call you in 1-2 weeks with the results of the testing. - In the meantime, continue with loratadine 10mg  daily as needed.  2. Anaphylaxis - unknown trigger - I am not sure that this is ACE inhibitor induced angioedema since you had hives. - We will look for red meat sensitivity with an alpha-gal panel. - We will call you in 1-2 weeks with the results.  - We will send in a prescription for an EpiPen.  - Anaphylaxis management plan provided. - Circle foods that you are exposed and we can do food testing at the next visit.  3. Return in about 10 days (around 10/01/2017) for SKIN TESTING at 4:30PM   Subjective:   Mitchell Pennington is a 67 y.o. male presenting today for evaluation of  Chief Complaint  Patient presents with  . Allergic Reaction    lisinopril, 3 weeks ago, hospital stay     Mitchell Pennington has a history of the following: Patient  Active Problem List   Diagnosis Date Noted  . 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 Angioedema 08/25/2017  . Hypertension 08/25/2017  . GERD (gastroesophageal reflux disease) 08/25/2017    History obtained from: chart review and patient.  Mitchell Pennington was referred by Lawerance Cruel, MD.     Mitchell Pennington is a 67 y.o. male presenting for an evaluation of anaphylaxis to lisinopril. He started the lisinopril 20 years ago.Three weeks ago, he woke up face swelling and tongue. He did have "welps" all over his body. It was extremely pruritic. It first started on a Saturday and then he felt weak when he got up. He just felt bad in the morning and noticed welps on his body. Sunday morning he woke up with tongue swelling and difficulty swallowing. He went to the hospital on May 19th initially. He was told to stop his lisinopril on May 19th. In the ED he received prednisone as well as antihistamines. He was discharged and symptoms worsened over the next couple of days. He was improved when he was discharged but then went back to his PCP one week after and he was given another dose of DepoMedrol.   He does eat a fair amount of red meat (around 3 times per week). He did eat an egg McMuffin on the Saturday morning before going to the ED initially. He does not eat a lot of soy or tree nuts, but otherwise tolerates all of the major food allergens without adverse event.  He does have seasonal allergy symptoms treated with loratadine. He has never been allergy tested in the past. He does not use a nose spray at all.   He did have one new medication - Ageless Male - one week before the onset of symptoms. But he only took it that once.      Otherwise, there is no history of other atopic diseases, including asthma or stinging insect allergie. There is no significant infectious history. Vaccinations are up to date.    Past Medical History: Patient Active Problem  List   Diagnosis Date Noted  . 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 Angioedema 08/25/2017  . Hypertension 08/25/2017  . GERD (gastroesophageal reflux disease) 08/25/2017    Medication List:  Allergies as of 09/21/2017      Reactions   Angiotensin Receptor Blockers    ACE-induced angioedema   Contrast Media [iodinated Diagnostic Agents] Shortness Of Breath   Lisinopril Anaphylaxis   Angioedema      Medication List        Accurate as of 09/21/17  8:46 PM. Always use your most recent med list.          aspirin EC 81 MG tablet Take 81 mg by mouth daily.   aspirin-acetaminophen-caffeine 250-250-65 MG tablet Commonly known as:  EXCEDRIN MIGRAINE Take 1 tablet by mouth every 6 (six) hours as needed for headache.   atenolol 25 MG tablet Commonly known as:  TENORMIN Take 1 tablet (25 mg total) by mouth daily.   diphenhydramine-acetaminophen 25-500 MG Tabs tablet Commonly known as:  TYLENOL PM Take 1 tablet by mouth every 6 (six) hours as needed (hives/itching).   EPINEPHrine 0.3 mg/0.3 mL Soaj injection Commonly known as:  EPIPEN 2-PAK Inject 0.3 mLs (0.3 mg total) into the muscle once for 1 dose.   hydrocortisone cream 1 % Apply 1 application topically See admin instructions. Apply topically after shaving - approximately 3 times week   loratadine 10 MG tablet Commonly known as:  CLARITIN Take 10 mg by mouth daily.   montelukast 10 MG tablet Commonly known as:  SINGULAIR Take 10 mg by mouth daily as needed (seasonal allergies).   neomycin-bacitracin-polymyxin Oint Commonly known as:  NEOSPORIN Apply 1 application topically See admin instructions. Apply topically after shaving - approximately three times week   pantoprazole 40 MG tablet Commonly known as:  PROTONIX Take 40 mg by mouth daily.   predniSONE 10 MG tablet Commonly known as:  DELTASONE Take 1 tablet (10 mg total) by mouth daily.   tadalafil 5 MG  tablet Commonly known as:  CIALIS Take 5 mg by mouth at bedtime.   triamcinolone cream 0.1 % Commonly known as:  KENALOG Apply 1 application topically 2 (two) times daily as needed (rash/itching).   vitamin B-12 1000 MCG tablet Commonly known as:  CYANOCOBALAMIN Take 2,000 mcg by mouth daily.       Birth History: non-contributory.   Developmental History: non-contributory.   Past Surgical History: History reviewed. No pertinent surgical history.   Family History: History reviewed. No pertinent family history.   Social History: Mitchell Pennington lives at home with his wife of two years (previous wife died from Alzheimer's). 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 He lives in a house that is 68 years old. There is laminate in the main living areas and carpeting in the bedroom. There is  gas heating and central cooling. There are no animals inside or outside of the home. There are no dust mite coverings on the bedding. There is no tobacco exposure.      Review of Systems: a 14-point review of systems is pertinent for what is mentioned in HPI.  Otherwise, all other systems were negative. Constitutional: negative other than that listed in the HPI Eyes: negative other than that listed in the HPI Ears, nose, mouth, throat, and face: negative other than that listed in the HPI Respiratory: negative other than that listed in the HPI Cardiovascular: negative other than that listed in the HPI Gastrointestinal: negative other than that listed in the HPI Genitourinary: negative other than that listed in the HPI Integument: negative other than that listed in the HPI Hematologic: negative other than that listed in the HPI Musculoskeletal: negative other than that listed in the HPI Neurological: negative other than that listed in the HPI Allergy/Immunologic: negative other than that listed in the  HPI    Objective:   Blood pressure 136/90, pulse 68, temperature 98.5 F (36.9 C), temperature source Oral, resp. rate (!) 24, height 5\' 9"  (1.753 m), weight 259 lb 9.6 oz (117.8 kg), SpO2 98 %. Body mass index is 38.34 kg/m.   Physical Exam:  General: Alert, interactive, in no acute distress. Pleasant male.  Eyes: No conjunctival injection bilaterally, no discharge on the right, no discharge on the left and no Horner-Trantas dots present. PERRL bilaterally. EOMI without pain. No photophobia.  Ears: Right TM pearly gray with normal light reflex, Left TM pearly gray with normal light reflex, Right TM intact without perforation and Left TM intact without perforation.  Nose/Throat: External nose within normal limits and septum midline. Turbinates edematous and pale with clear discharge. Posterior oropharynx mildly erythematous without cobblestoning in the posterior oropharynx. Tonsils 2+ without exudates.  Tongue without thrush. Neck: Supple without thyromegaly. Trachea midline. Adenopathy: no enlarged lymph nodes appreciated in the anterior cervical, occipital, axillary, epitrochlear, inguinal, or popliteal regions. Lungs: Clear to auscultation without wheezing, rhonchi or rales. No increased work of breathing. CV: Normal S1/S2. No murmurs. Capillary refill <2 seconds.  Abdomen: Nondistended, nontender. No guarding or rebound tenderness. Bowel sounds present in all fields and hypoactive  Skin: Warm and dry, without lesions or rashes. Extremities:  No clubbing, cyanosis or edema. Neuro:   Grossly intact. No focal deficits appreciated. Responsive to questions.  Diagnostic studies: deferred due to recent antihistamine use      Salvatore Marvel, MD [facility] [place]

## 2017-09-21 NOTE — Patient Instructions (Addendum)
1. Seasonal allergic rhinitis - We will look for allergies in blood work. - We will call you in 1-2 weeks with the results of the testing. - In the meantime, continue with loratadine 10mg  daily as needed.  2. Anaphylaxis - unknown trigger - I am not sure that this is ACE inhibitor induced angioedema since you had hives. - We will look for red meat sensitivity with an alpha-gal panel. - We will call you in 1-2 weeks with the results.  - We will send in a prescription for an EpiPen.  - Anaphylaxis management plan provided. - Circle foods that you are exposed and we can do food testing at the next visit.  3. Return in about 10 days (around 10/01/2017) for SKIN TESTING at 4:30PM   Please inform us of any Emergency Department visits, hospitalizations, or changes in symptoms. Call us before going to the ED for breathing or allergy symptoms since we might be able to fit you in for a sick visit. Feel free to contact us anytime with any questions, problems, or concerns.  It was a pleasure to meet you today!  Websites that have reliable patient information: 1. American Academy of Asthma, Allergy, and Immunology: www.aaaai.org 2. Food Allergy Research and Education (FARE): foodallergy.org 3. Mothers of Asthmatics: http://www.asthmacommunitynetwork.org 4. American College of Allergy, Asthma, and Immunology: MonthlyElectricBill.co.uk   Make sure you are registered to vote!

## 2017-09-25 LAB — IGE+ALLERGENS ZONE 2(30)
Aspergillus Fumigatus IgE: <0.1 kU/L
Bahia Grass IgE: <0.1 kU/L
Cat Dander IgE: <0.1 kU/L
Cockroach, American IgE: <0.1 kU/L
Common Silver Birch IgE: <0.1 kU/L
D Pteronyssinus IgE: <0.1 kU/L
Elm, American IgE: <0.1 kU/L
IgE (Immunoglobulin E), Serum: 140 IU/mL (ref 6–495)
Johnson Grass IgE: <0.1 kU/L
Maple/Box Elder IgE: <0.1 kU/L
Mugwort IgE Qn: <0.1 kU/L
Oak, White IgE: <0.1 kU/L
Pigweed, Rough IgE: <0.1 kU/L
Plantain, English IgE: <0.1 kU/L
Ragweed, Short IgE: <0.1 kU/L
Sheep Sorrel IgE Qn: <0.1 kU/L
Stemphylium Herbarum IgE: <0.1 kU/L
Timothy Grass IgE: <0.1 kU/L

## 2017-09-25 LAB — ALPHA-GAL PANEL
Beef (Bos spp) IgE: <0.1 kU/L (ref <0.35)
Class Interpretation: 0
Class Interpretation: 0
LAMB CLASS INTERPRETATION: 0
Pork (Sus spp) IgE: <0.1 kU/L (ref <0.35)

## 2017-09-29 DIAGNOSIS — E669 Obesity, unspecified: Secondary | ICD-10-CM | POA: Diagnosis not present

## 2017-09-29 DIAGNOSIS — R9431 Abnormal electrocardiogram [ECG] [EKG]: Secondary | ICD-10-CM | POA: Diagnosis not present

## 2017-09-29 DIAGNOSIS — I119 Hypertensive heart disease without heart failure: Secondary | ICD-10-CM | POA: Diagnosis not present

## 2017-10-01 ENCOUNTER — Ambulatory Visit (INDEPENDENT_AMBULATORY_CARE_PROVIDER_SITE_OTHER): Payer: Medicare Other | Admitting: Allergy & Immunology

## 2017-10-01 ENCOUNTER — Encounter: Payer: Self-pay | Admitting: Allergy & Immunology

## 2017-10-01 VITALS — BP 126/68 | HR 68 | Resp 18

## 2017-10-01 DIAGNOSIS — T782XXA Anaphylactic shock, unspecified, initial encounter: Secondary | ICD-10-CM | POA: Insufficient documentation

## 2017-10-01 DIAGNOSIS — J310 Chronic rhinitis: Secondary | ICD-10-CM | POA: Insufficient documentation

## 2017-10-01 DIAGNOSIS — T782XXD Anaphylactic shock, unspecified, subsequent encounter: Secondary | ICD-10-CM

## 2017-10-01 NOTE — Patient Instructions (Addendum)
1. Non-allergic rhinitis - Testing for environmental allergens was negative. - Continue with loratadine 10mg  daily as needed.  2. Anaphylaxis - unknown trigger - I am not sure that this is ACE inhibitor induced angioedema since you had hives. - We will rule out an ACE-inhibitor allergy with a drug challenge appointment. - This will take approximately two hours. - We can watch you and treat you as needed if you have a reaction. - We deferred food allergy testing since you did not think that this was related to your reaction.    3. Return in about 2 weeks (around 10/15/2017).   Please inform us of any Emergency Department visits, hospitalizations, or changes in symptoms. Call us before going to the ED for breathing or allergy symptoms since we might be able to fit you in for a sick visit. Feel free to contact us anytime with any questions, problems, or concerns.  It was a pleasure to see you again today!  Websites that have reliable patient information: 1. American Academy of Asthma, Allergy, and Immunology: www.aaaai.org 2. Food Allergy Research and Education (FARE): foodallergy.org 3. Mothers of Asthmatics: http://www.asthmacommunitynetwork.org 4. American College of Allergy, Asthma, and Immunology: MonthlyElectricBill.co.uk   Make sure you are registered to vote!

## 2017-10-01 NOTE — Progress Notes (Signed)
FOLLOW UP  Date of Service/Encounter:  10/01/17   Assessment:   Non-allergic rhinitis  Anaphylaxis - unlikely to be related to ACEI   Plan/Recommendations:   1. Non-allergic rhinitis - Testing for environmental allergens was negative. - Continue with loratadine 10mg  daily as needed.  2. Anaphylaxis - unknown trigger - I am not sure that this is ACE inhibitor induced angioedema since you had hives. - We will rule out an ACE-inhibitor allergy with a drug challenge appointment. - This will take approximately two hours. - We can watch you and treat you as needed if you have a reaction. - We deferred food allergy testing since you did not think that this was related to your reaction.   -We will bring him in for a challenge at which time we will give 25% of the lisinopril dose, wait 30 minutes, and then give the remaining 75%.  3. Return in about 2 weeks (around 10/15/2017).  Subjective:   Mitchell Pennington is a 67 y.o. male presenting today for follow up of  Chief Complaint  Patient presents with  . Allergy Testing    Mitchell Pennington has a history of the following: Patient Active Problem List   Diagnosis Date Noted  . Non-allergic rhinitis 10/01/2017  . Anaphylactic syndrome 10/01/2017  . 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 Angioedema 08/25/2017  . Hypertension 08/25/2017  . GERD (gastroesophageal reflux disease) 08/25/2017    History obtained from: chart review and patient.  Mitchell Pennington Primary Care Provider is Mitchell Cruel, MD.     Mitchell Pennington is a 67 y.o. male presenting for a follow up visit. Mitchell Pennington was last seen two weeks ago. At that time, he was having episodes of anaphylaxis, which was initially diagnosed as ACEI anaphylaxis. I did not feel that this was consistent with a reaction secondary to an ACEI given the time course. We obtained labs to look for environmental allergies, which was  negative. We also did an alpha-gal panel which was negative.    Since the last visit, he has done well. He has had no anaphylaxis reactions since the last visit. At the last visit, we did discuss doing food allergy testing. We provided a list of foods that could be tested for, and he brought these home to look over. In the interim, he has decided that these are not related to foods at all. He has noted no foods that seem to trigger the reactions, and he eats a wide variety of the foods, including those listed on the list. He does not want to do food allergy testing today.  Therefore, he would like to proceed with the ACEI challenge. He was under the impression that he would be tested for ACE inhibitors today.  Otherwise, there have been no changes to his past medical history, surgical history, family history, or social history.    Review of Systems: a 14-point review of systems is pertinent for what is mentioned in HPI.  Otherwise, all other systems were negative. Constitutional: negative other than that listed in the HPI Eyes: negative other than that listed in the HPI Ears, nose, mouth, throat, and face: negative other than that listed in the HPI Respiratory: negative other than that listed in the HPI Cardiovascular: negative other than that listed in the HPI Gastrointestinal: negative other than that listed in the HPI Genitourinary: negative other than that listed in the HPI Integument: negative other than that listed in the HPI Hematologic: negative other than  that listed in the HPI Musculoskeletal: negative other than that listed in the HPI Neurological: negative other than that listed in the HPI Allergy/Immunologic: negative other than that listed in the HPI    Objective:   Blood pressure 126/68, pulse 68, resp. rate 18, SpO2 96 %. There is no height or weight on file to calculate BMI.   Physical Exam:  General: Alert, interactive, in no acute distress. Pleasant. Eyes: No  conjunctival injection bilaterally, no discharge on the right, no discharge on the left and no Horner-Trantas dots present. PERRL bilaterally. EOMI without pain. No photophobia.  Ears: Right TM pearly gray with normal light reflex, Left TM pearly gray with normal light reflex, Right TM intact without perforation and Left TM intact without perforation.  Nose/Throat: External nose within normal limits and septum midline. Turbinates edematous without discharge. Posterior oropharynx mildly erythematous without cobblestoning in the posterior oropharynx. Tonsils 2+ without exudates.  Tongue without thrush. Lungs: Clear to auscultation without wheezing, rhonchi or rales. No increased work of breathing. CV: Normal S1/S2. No murmurs. Capillary refill <2 seconds.  Skin: Warm and dry, without lesions or rashes. Neuro:   Grossly intact. No focal deficits appreciated. Responsive to questions.  Diagnostic studies: none       Salvatore Marvel, MD  [facility] [place]

## 2017-11-05 ENCOUNTER — Encounter: Payer: Medicare Other | Admitting: Allergy & Immunology

## 2018-01-25 DIAGNOSIS — H25813 Combined forms of age-related cataract, bilateral: Secondary | ICD-10-CM | POA: Diagnosis not present

## 2018-01-25 DIAGNOSIS — H401111 Primary open-angle glaucoma, right eye, mild stage: Secondary | ICD-10-CM | POA: Diagnosis not present

## 2018-01-26 ENCOUNTER — Ambulatory Visit: Payer: Medicare Other | Admitting: Internal Medicine

## 2018-01-30 DIAGNOSIS — Z23 Encounter for immunization: Secondary | ICD-10-CM | POA: Diagnosis not present

## 2018-02-18 DIAGNOSIS — H25013 Cortical age-related cataract, bilateral: Secondary | ICD-10-CM | POA: Diagnosis not present

## 2018-02-18 DIAGNOSIS — H401112 Primary open-angle glaucoma, right eye, moderate stage: Secondary | ICD-10-CM | POA: Diagnosis not present

## 2018-02-18 DIAGNOSIS — H401123 Primary open-angle glaucoma, left eye, severe stage: Secondary | ICD-10-CM | POA: Insufficient documentation

## 2018-02-18 DIAGNOSIS — H2513 Age-related nuclear cataract, bilateral: Secondary | ICD-10-CM | POA: Diagnosis not present

## 2018-02-19 DIAGNOSIS — H401132 Primary open-angle glaucoma, bilateral, moderate stage: Secondary | ICD-10-CM | POA: Diagnosis not present

## 2018-02-19 DIAGNOSIS — H2513 Age-related nuclear cataract, bilateral: Secondary | ICD-10-CM | POA: Diagnosis not present

## 2018-02-19 DIAGNOSIS — H35463 Secondary vitreoretinal degeneration, bilateral: Secondary | ICD-10-CM | POA: Diagnosis not present

## 2018-04-19 DIAGNOSIS — H401123 Primary open-angle glaucoma, left eye, severe stage: Secondary | ICD-10-CM | POA: Diagnosis not present

## 2018-04-19 DIAGNOSIS — H401112 Primary open-angle glaucoma, right eye, moderate stage: Secondary | ICD-10-CM | POA: Diagnosis not present

## 2018-04-19 DIAGNOSIS — H2513 Age-related nuclear cataract, bilateral: Secondary | ICD-10-CM | POA: Diagnosis not present

## 2018-04-19 DIAGNOSIS — H25013 Cortical age-related cataract, bilateral: Secondary | ICD-10-CM | POA: Diagnosis not present

## 2018-05-14 DIAGNOSIS — M25511 Pain in right shoulder: Secondary | ICD-10-CM | POA: Diagnosis not present

## 2018-05-14 DIAGNOSIS — R21 Rash and other nonspecific skin eruption: Secondary | ICD-10-CM | POA: Diagnosis not present

## 2018-08-31 DIAGNOSIS — R972 Elevated prostate specific antigen [PSA]: Secondary | ICD-10-CM | POA: Diagnosis not present

## 2018-09-07 DIAGNOSIS — R3912 Poor urinary stream: Secondary | ICD-10-CM | POA: Diagnosis not present

## 2018-09-07 DIAGNOSIS — N200 Calculus of kidney: Secondary | ICD-10-CM | POA: Diagnosis not present

## 2018-09-07 DIAGNOSIS — N5201 Erectile dysfunction due to arterial insufficiency: Secondary | ICD-10-CM | POA: Diagnosis not present

## 2018-09-07 DIAGNOSIS — N281 Cyst of kidney, acquired: Secondary | ICD-10-CM | POA: Diagnosis not present

## 2018-09-07 DIAGNOSIS — N401 Enlarged prostate with lower urinary tract symptoms: Secondary | ICD-10-CM | POA: Diagnosis not present

## 2018-10-04 DIAGNOSIS — K219 Gastro-esophageal reflux disease without esophagitis: Secondary | ICD-10-CM | POA: Diagnosis not present

## 2018-10-04 DIAGNOSIS — R21 Rash and other nonspecific skin eruption: Secondary | ICD-10-CM | POA: Diagnosis not present

## 2018-10-04 DIAGNOSIS — I10 Essential (primary) hypertension: Secondary | ICD-10-CM | POA: Diagnosis not present

## 2018-10-04 DIAGNOSIS — J309 Allergic rhinitis, unspecified: Secondary | ICD-10-CM | POA: Diagnosis not present

## 2018-10-04 DIAGNOSIS — M25519 Pain in unspecified shoulder: Secondary | ICD-10-CM | POA: Diagnosis not present

## 2018-10-05 ENCOUNTER — Other Ambulatory Visit: Payer: Self-pay | Admitting: *Deleted

## 2018-10-05 DIAGNOSIS — Z20822 Contact with and (suspected) exposure to covid-19: Secondary | ICD-10-CM

## 2018-10-07 LAB — NOVEL CORONAVIRUS, NAA: SARS-CoV-2, NAA: NOT DETECTED

## 2018-11-03 ENCOUNTER — Ambulatory Visit: Payer: Medicare Other | Attending: Family Medicine | Admitting: Physical Therapy

## 2018-11-03 ENCOUNTER — Other Ambulatory Visit: Payer: Self-pay

## 2018-11-03 ENCOUNTER — Encounter: Payer: Self-pay | Admitting: Physical Therapy

## 2018-11-03 DIAGNOSIS — M6281 Muscle weakness (generalized): Secondary | ICD-10-CM | POA: Diagnosis not present

## 2018-11-03 DIAGNOSIS — M25511 Pain in right shoulder: Secondary | ICD-10-CM | POA: Diagnosis not present

## 2018-11-03 DIAGNOSIS — G8929 Other chronic pain: Secondary | ICD-10-CM | POA: Diagnosis not present

## 2018-11-03 NOTE — Therapy (Addendum)
[place], [place], [address] Phone: [phone]   Fax:  [phone]  Physical Therapy Evaluation / Discharge summary  Patient Details  Name: Mitchell Pennington MRN: [id number] Date of Birth: 06-Apr-1951 Referring Provider (PT): Lawerance Cruel   Encounter Date: 11/03/2018  PT End of Session - 11/03/18 1321    Visit Number  1    Number of Visits  13    Date for PT Re-Evaluation  12/15/18    Authorization Type  MCR: KX mod by 15th visit, progress note at 10th visit    PT Start Time  1315    PT Stop Time  1355    PT Time Calculation (min)  40 min    Activity Tolerance  Patient tolerated treatment well    Behavior During Therapy  [facility] for tasks assessed/performed       Past Medical History:  Diagnosis Date  . Angio-edema   . Hypertension   . Urticaria     History reviewed. No pertinent surgical history.  There were no vitals filed for this visit.   Subjective Assessment - 11/03/18 1324    Subjective  pt is a 68 y.o M with CC of R shoulder pain starting 8 months ago with no MOI report it was difficult getting the shoulder moving and lifting. over the last 6 months he reports the symptoms are improving slightly. He reports no hx of R shoulder pain or issues. reports the pain stays mostly in the shoulder with occasional referral to the R chest/flank    Limitations  Lifting    How long can you sit comfortably?  unlimited    How long can you stand comfortably?  unlimited    How long can you walk comfortably?  unlimited    Diagnostic tests  N/A    Patient Stated Goals  to the shoulder mobility back, know whats going on.    Currently in Pain?  Yes    Pain Score  4    last took tylenol at 11am, and at worst 7-8/10   Pain Location  Shoulder    Pain Orientation  Right    Pain Descriptors / Indicators  Aching   stiffness   Pain Type  Chronic pain    Pain Onset  More than a month ago    Pain Frequency   Intermittent    Aggravating Factors   lifting the shoulder, moving the shoulder around    Pain Relieving Factors  tylenol, resting    Effect of Pain on Daily Activities  limited RUE use         [facility] PT Assessment - 11/03/18 1321      Assessment   Medical Diagnosis  R shoulder pain    Referring Provider (PT)  Lawerance Cruel    Onset Date/Surgical Date  --   6 months ago   Hand Dominance  Right    Next MD Visit  PRN    Prior Therapy  yes      Precautions   Precautions  None      Restrictions   Weight Bearing Restrictions  No      Balance Screen   Has the patient fallen in the past 6 months  No      [place] residence    Living Arrangements  Spouse/significant other    Available Help at Discharge  Family    Type of [place]  Home Access  Level entry    Home Layout  Two level    Alternate Level Stairs-Number of Steps  20    Alternate Level Stairs-Rails  Left   ascending   Home Equipment  Walker - 2 wheels;Kasandra Knudsen - single point      Prior Function   Level of Independence  Independent with basic ADLs    Vocation  Retired    Leisure  traveling,       Cognition   Overall Cognitive Status  Within Functional Limits for tasks assessed      Observation/Other Assessments   Focus on Therapeutic Outcomes (FOTO)   50% limited   predicted 31% limited     Posture/Postural Control   Posture/Postural Control  Postural limitations    Postural Limitations  Rounded Shoulders;Forward head      ROM / Strength   AROM / PROM / Strength  AROM;Strength;PROM      AROM   AROM Assessment Site  Shoulder    Right/Left Shoulder  Right;Left    Right Shoulder Extension  60 Degrees    Right Shoulder Flexion  125 Degrees    Right Shoulder ABduction  102 Degrees    Right Shoulder Internal Rotation  --   T10   Right Shoulder External Rotation  --   T3   Left Shoulder Extension  45 Degrees    Left Shoulder Flexion  130 Degrees    Left  Shoulder ABduction  102 Degrees    Left Shoulder Internal Rotation  --   T8   Left Shoulder External Rotation  --   T2     PROM   Overall PROM   Within functional limits for tasks performed    PROM Assessment Site  Shoulder    Right/Left Shoulder  Right      Strength   Strength Assessment Site  Shoulder;Hand    Right/Left Shoulder  Right;Left    Right Shoulder Flexion  3+/5    Right Shoulder Extension  5/5    Right Shoulder ABduction  4/5    Right Shoulder Internal Rotation  4/5    Right Shoulder External Rotation  4/5    Left Shoulder Flexion  4+/5    Left Shoulder Extension  5/5    Left Shoulder ABduction  4/5    Left Shoulder Internal Rotation  4+/5    Left Shoulder External Rotation  4+/5    Right Hand Grip (lbs)  48   52,47,45   Left Hand Grip (lbs)  57   66,51,54     Palpation   Palpation comment  TTP along the Bicep brachii, and along the sub-acromial space      Special Tests    Special Tests  Rotator Cuff Impingement    Rotator Cuff Impingment tests  Michel Bickers test;Empty Can test;Full Can test;Painful Arc of Motion;other      Hawkins-Kennedy test   Findings  Positive    Side  Right    Comments  in horizontaly abduction position      Empty Can test   Findings  Positive    Side  Right      Full Can test   Findings  Negative    Side  Right      Painful Arc of Motion   Findings  Negative      other   Findings  Positive    Side  Right    Comments  scapular assist test   relief of pain and ease  of lifting the arm               Objective measurements completed on examination: See above findings.      [facility] Adult PT Treatment/Exercise - 11/03/18 1321      Exercises   Exercises  Shoulder      Shoulder Exercises: Supine   Protraction  Strengthening;10 reps;Both      Shoulder Exercises: Standing   External Rotation  Strengthening;Right;10 reps;Theraband    Theraband Level (Shoulder External Rotation)  Level 3 (Green)    Internal  Rotation  Strengthening;10 reps;Theraband    Theraband Level (Shoulder Internal Rotation)  Level 3 (Green)    Row  Strengthening;Both;10 reps;Theraband    Theraband Level (Shoulder Row)  Level 3 (Green)      Shoulder Exercises: Stretch   Other Shoulder Stretches  upper trap stretch2 x  30 sec on the R             PT Education - 11/03/18 1320    Education Details  evaluation findings, POC, goals. HEP with proper form/ rationale    Person(s) Educated  Patient    Methods  Explanation;Verbal cues;Handout    Comprehension  Verbalized understanding;Verbal cues required       PT Short Term Goals - 11/03/18 1356      PT SHORT TERM GOAL #1   Title  pt to be I with inital HEP    Time  3    Period  Weeks    Status  New    Target Date  11/24/18        PT Long Term Goals - 11/03/18 1357      PT LONG TERM GOAL #1   Title  increase r shoulder strength grossly to >/= 4+/5 for functional ROM required for ADLs    Time  6    Period  Weeks    Status  New    Target Date  12/15/18      PT LONG TERM GOAL #2   Title  pt to maintain functional ROM noting no pain throughout all motions for QOL    Time  6    Period  Weeks    Status  New    Target Date  12/15/18      PT LONG TERM GOAL #3   Title  pt to be able to lift and lwoer >/= 10# to and from and overhead shelf, Push/ pull >/= 20# with no report of pain or discomfort for functional lifting    Time  6    Period  Weeks    Status  New    Target Date  12/15/18      PT LONG TERM GOAL #4   Title  increase FOTO score to </= 31% limited to demo improvement in function    Time  6    Period  Weeks    Status  New    Target Date  12/15/18      PT LONG TERM GOAL #5   Title  pt to be I with all HEP given as of last visit to maintain and progress current level of function    Time  6    Period  Weeks    Status  New    Target Date  12/15/18             Plan - 11/03/18 1353    Clinical Impression Statement  pt presents to  OPPT with CC of chronic R shoulder pain  with no MOI. He has functional ROM compared bil reporting pain along the bicep. mild weakness in the R shoulder noted compared bil during MMT. TTP along the bicep brachii and sub-acromial space, and special testing is consistent with potential impingment pathology. He would benefit from physical therapy to decrease R shoulder pain, increase strength, and maximize his overall function by addressing the deficits listed.    Personal Factors and Comorbidities  Age    Stability/Clinical Decision Making  Stable/Uncomplicated    Clinical Decision Making  Low    Rehab Potential  Good    PT Frequency  2x / week    PT Duration  6 weeks    PT Treatment/Interventions  ADLs/Self Care Home Management;Cryotherapy;Electrical Stimulation;Iontophoresis '4mg'$ /ml Dexamethasone;Moist Heat;Ultrasound;Therapeutic activities;Therapeutic exercise;Neuromuscular re-education;Manual techniques;Passive range of motion;Dry needling;Taping    PT Next Visit Plan  review/ update HEP PRN, scapular stability, shoulder strengthening, STW along bicep brachii, modalities PRN    PT Home Exercise Plan  rows, shoulder IR/ER, ceiling punches, upper trap stretch    Consulted and Agree with Plan of Care  Patient       Patient will benefit from skilled therapeutic intervention in order to improve the following deficits and impairments:  Pain, Impaired UE functional use, Decreased strength, Improper body mechanics, Postural dysfunction, Decreased endurance, Decreased range of motion  Visit Diagnosis: 1. Chronic right shoulder pain   2. Muscle weakness (generalized)        Problem List Patient Active Problem List   Diagnosis Date Noted  . Non-allergic rhinitis 10/01/2017  . Anaphylactic syndrome 10/01/2017  . 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 Angioedema 08/25/2017  . Hypertension 08/25/2017  . GERD (gastroesophageal reflux disease) 08/25/2017    Starr Lake PT, DPT, LAT, ATC  11/03/18  2:00 PM      [facility] [address] [place], [place], [address] Phone: [phone]   Fax:  [phone]  Name: Mitchell Pennington MRN: [id number] Date of Birth: [birth year]       PHYSICAL THERAPY DISCHARGE SUMMARY  Visits from Start of Care: 1  Current functional level related to goals / functional outcomes: unknown   Remaining deficits: unknown   Education / Equipment: HEP,   Plan: Patient agrees to discharge.  Patient goals were not met. Patient is being discharged due to not returning since the last visit.  ?????         Imelda Dandridge PT, DPT, LAT, ATC  11/18/18  12:50 PM

## 2018-11-09 ENCOUNTER — Ambulatory Visit: Payer: Medicare Other | Admitting: Physical Therapy

## 2018-11-11 ENCOUNTER — Ambulatory Visit: Payer: Medicare Other | Admitting: Physical Therapy

## 2018-11-11 DIAGNOSIS — I10 Essential (primary) hypertension: Secondary | ICD-10-CM | POA: Diagnosis not present

## 2018-11-11 DIAGNOSIS — K219 Gastro-esophageal reflux disease without esophagitis: Secondary | ICD-10-CM | POA: Diagnosis not present

## 2018-11-11 DIAGNOSIS — J309 Allergic rhinitis, unspecified: Secondary | ICD-10-CM | POA: Diagnosis not present

## 2018-11-19 ENCOUNTER — Ambulatory Visit: Payer: Medicare Other | Admitting: Physical Therapy

## 2018-11-22 ENCOUNTER — Ambulatory Visit: Payer: Medicare Other

## 2018-11-26 ENCOUNTER — Encounter: Payer: Medicare Other | Admitting: Physical Therapy

## 2018-11-29 ENCOUNTER — Encounter: Payer: Medicare Other | Admitting: Physical Therapy

## 2018-12-01 DIAGNOSIS — H25013 Cortical age-related cataract, bilateral: Secondary | ICD-10-CM | POA: Diagnosis not present

## 2018-12-01 DIAGNOSIS — H401123 Primary open-angle glaucoma, left eye, severe stage: Secondary | ICD-10-CM | POA: Diagnosis not present

## 2018-12-01 DIAGNOSIS — H2513 Age-related nuclear cataract, bilateral: Secondary | ICD-10-CM | POA: Diagnosis not present

## 2018-12-01 DIAGNOSIS — H401112 Primary open-angle glaucoma, right eye, moderate stage: Secondary | ICD-10-CM | POA: Diagnosis not present

## 2018-12-03 ENCOUNTER — Ambulatory Visit: Payer: Medicare Other | Admitting: Physical Therapy

## 2018-12-16 IMAGING — CT CT ABD-PELV W/O CM
2 of 4 series · 15 of 46 positions shown, 17 images · non-contrast
Comparison: None.

CLINICAL DATA: 66 y/o  M; nausea and vomiting.

EXAM:
CT ABDOMEN AND PELVIS WITHOUT CONTRAST
TECHNIQUE: Multidetector CT imaging of the abdomen and pelvis was performed
following the standard protocol without IV contrast.

[Series 2: axial st · axial · 0.83mm/px · z∈[-469,+1]mm · 12 of 106 slices shown, 14 images]
[im 6/106  soft-tissue]
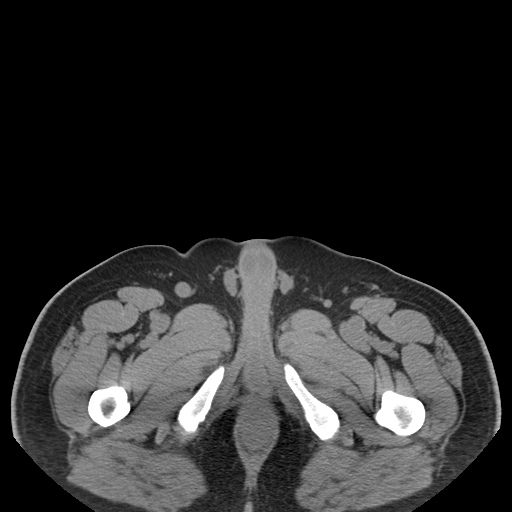
[im 6/106  bone]
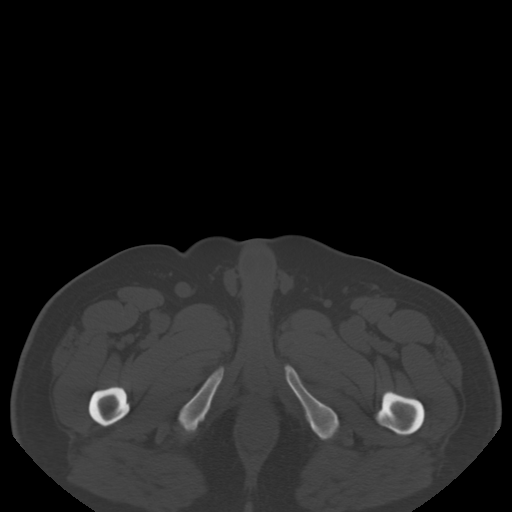
[im 17/106  soft-tissue]
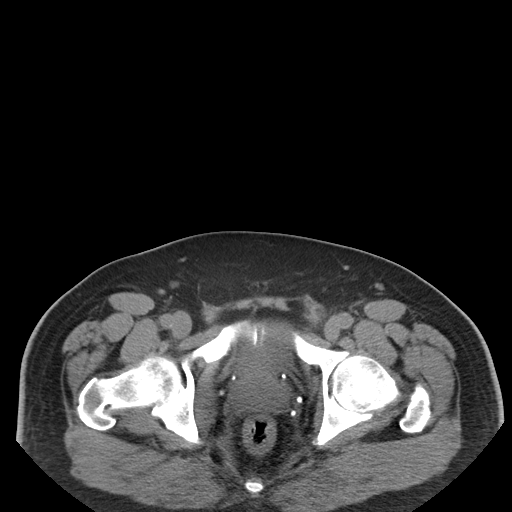
[im 23/106  soft-tissue]
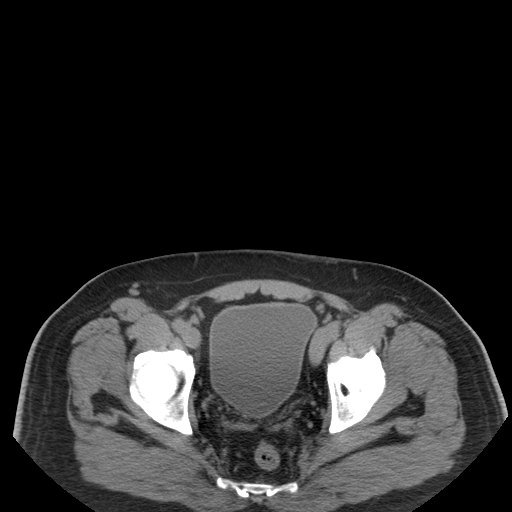
[im 34/106  soft-tissue]
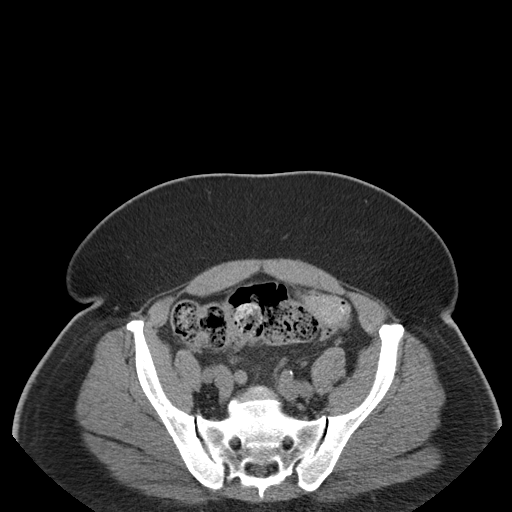
[im 39/106  soft-tissue]
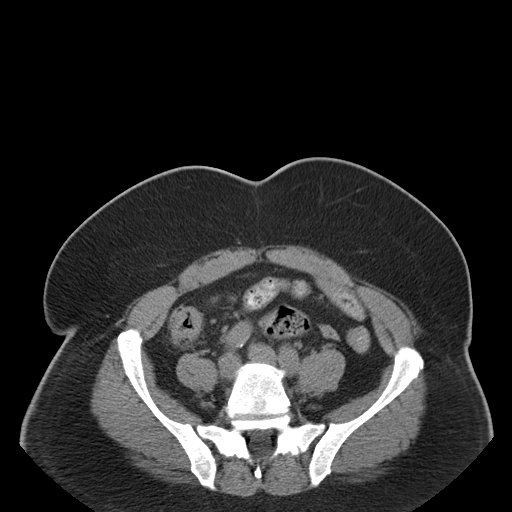
[im 50/106  soft-tissue]
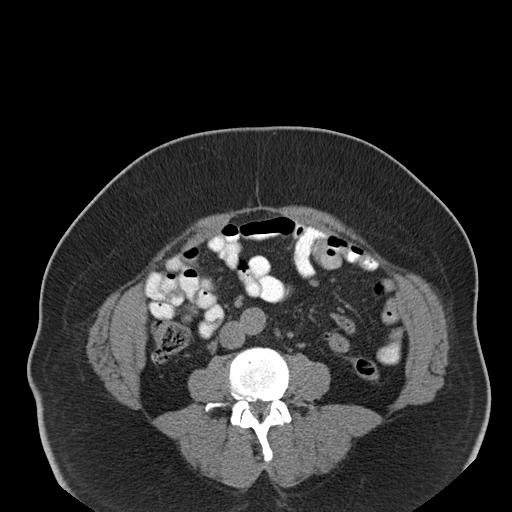
[im 56/106  soft-tissue]
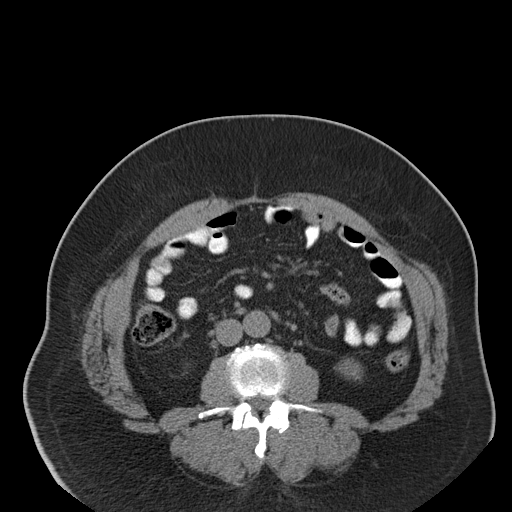
[im 67/106  soft-tissue]
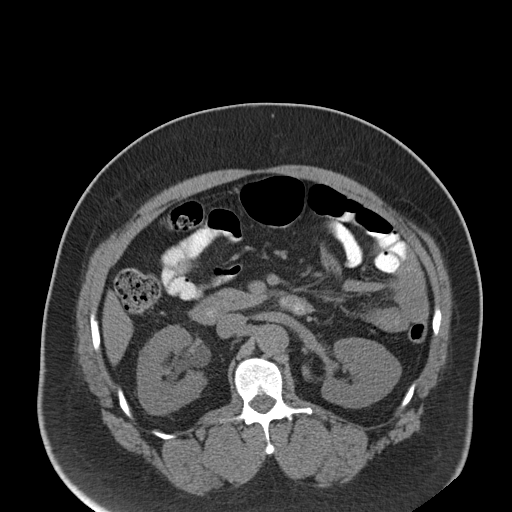
[im 72/106  soft-tissue]
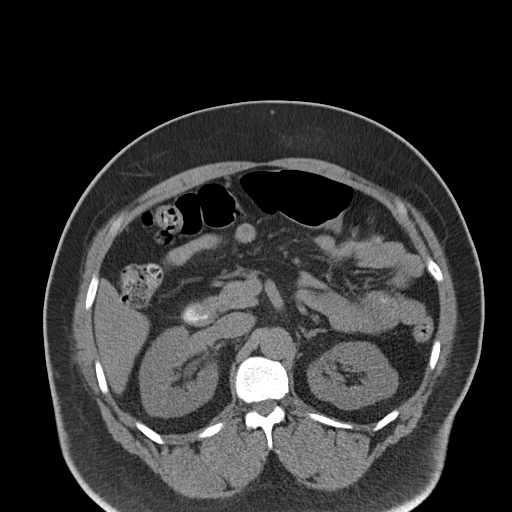
[im 72/106  bone]
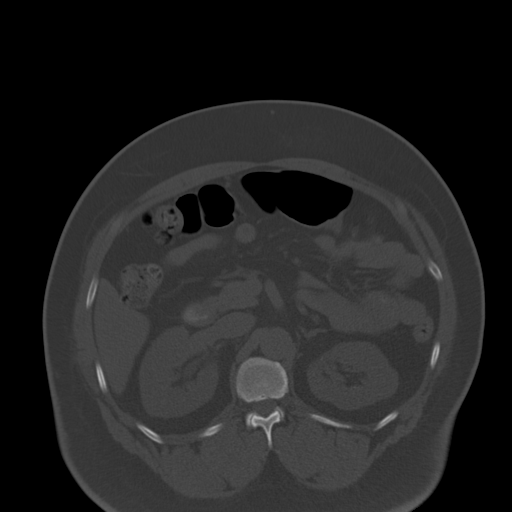
[im 83/106  soft-tissue]
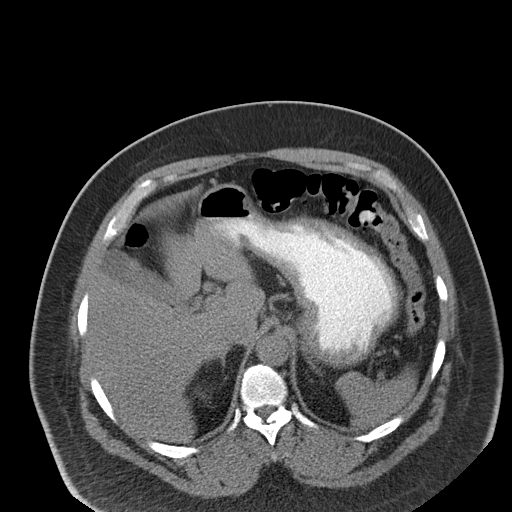
[im 89/106  soft-tissue]
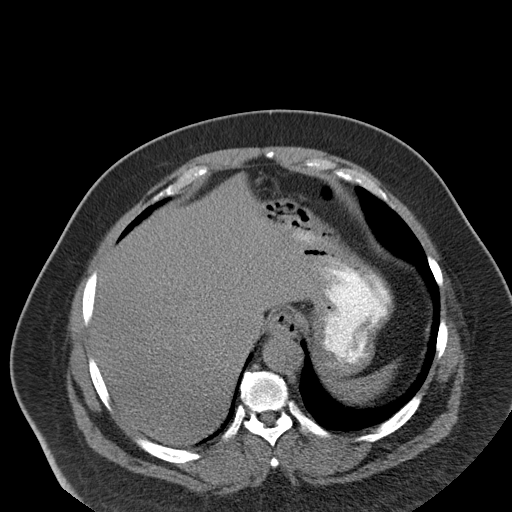
[im 100/106  soft-tissue]
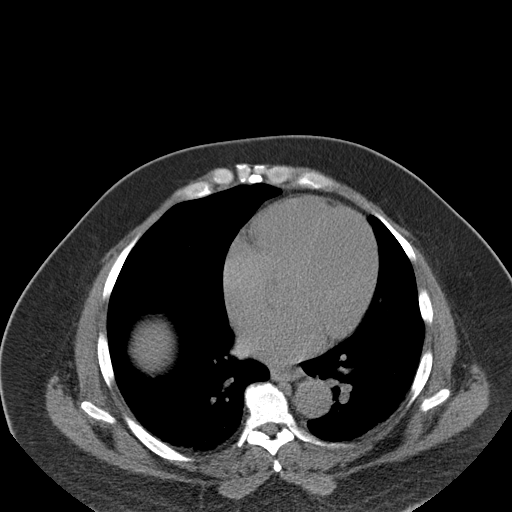

[Series 5: coronal st · coronal · 0.82mm/px · 3 of 129 slices shown]
[im 43/129  soft-tissue]
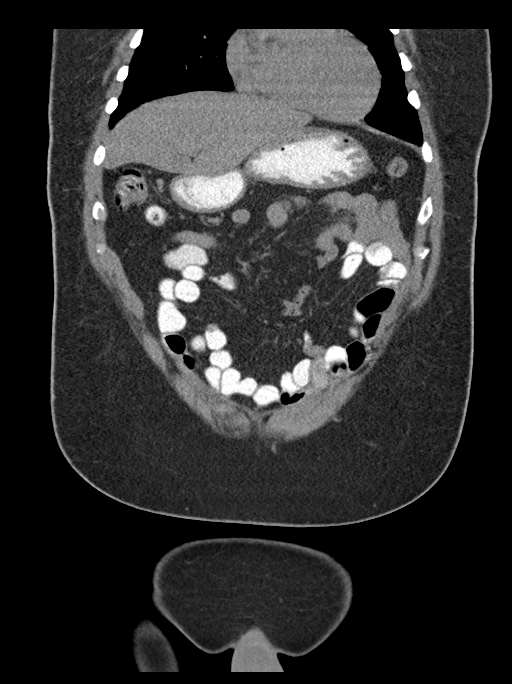
[im 57/129  soft-tissue]
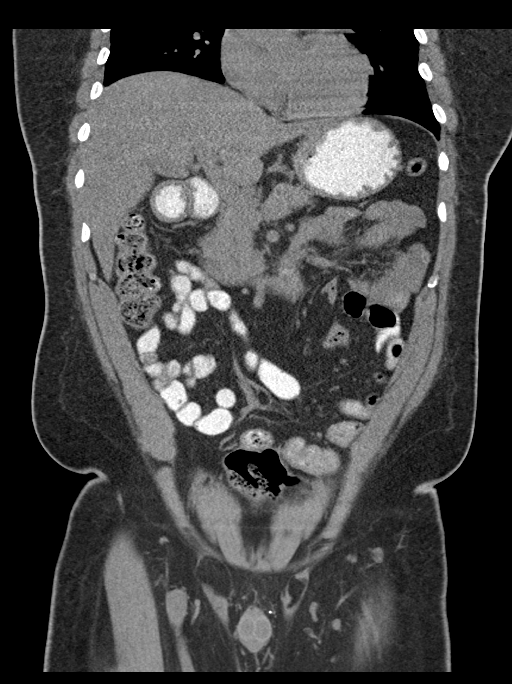
[im 72/129  soft-tissue]
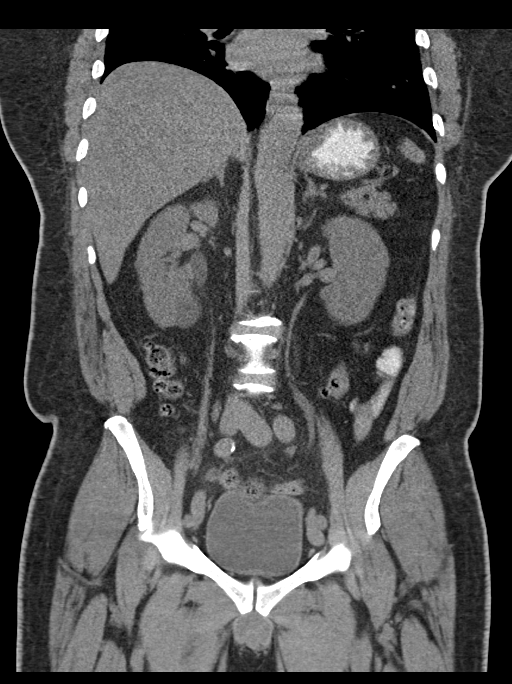

[15 of 46 positions shown; findings below may reference images not displayed]

FINDINGS: Lower chest: 4 mm right middle lobe juxtapleural nodule (series 4,
image 20). Mild gynecomastia.

Hepatobiliary: No focal liver abnormality is seen. No gallstones,
gallbladder wall thickening, or biliary dilatation.

Pancreas: Unremarkable. No pancreatic ductal dilatation or
surrounding inflammatory changes.

Spleen: Normal in size without focal abnormality.

Adrenals/Urinary Tract: Bilateral renal stones measuring up to 7 mm
in the right kidney lower pole and punctate in left interpolar
kidney. Right kidney lower pole cyst measuring 39 mm. Mild right
hydronephrosis with 4 mm stone in the right distal ureter
approximately 5-6 cm upstream to the ureterovesicular junction.
Normal left ureter. Normal bladder. Normal adrenal glands.

Stomach/Bowel: Stomach is within normal limits. Appendix not
identified. No evidence of bowel wall thickening, distention, or
inflammatory changes.

Vascular/Lymphatic: No significant vascular findings are present. No
enlarged abdominal or pelvic lymph nodes.

Reproductive: Prostate measures 4.2 x 5.1 x 4.3 cm (volume = 48
cm^3).

Other: Small paraumbilical hernia containing fat.

Musculoskeletal: No fracture is seen. Mild-to-moderate lumbar
spondylosis with prominent lower lumbar facet arthrosis.
IMPRESSION: 1. Mild right hydronephrosis with 4 mm stone in distal right ureter
approximately 5-6 cm upstream to ureterovesicular junction.
2. Bilateral nonobstructing kidney stones.
3. 4 mm right middle lobe pulmonary nodule. No follow-up needed if
patient is low-risk. Non-contrast chest CT can be considered in 12
months if patient is high-risk. This recommendation follows the
consensus statement: Guidelines for Management of Incidental
Pulmonary Nodules Detected on CT Images: From the [HOSPITAL]

By: Maafarey Iseco M.D.

## 2019-06-27 DIAGNOSIS — H25013 Cortical age-related cataract, bilateral: Secondary | ICD-10-CM | POA: Diagnosis not present

## 2019-06-27 DIAGNOSIS — H2513 Age-related nuclear cataract, bilateral: Secondary | ICD-10-CM | POA: Diagnosis not present

## 2019-06-27 DIAGNOSIS — H401112 Primary open-angle glaucoma, right eye, moderate stage: Secondary | ICD-10-CM | POA: Diagnosis not present

## 2019-06-27 DIAGNOSIS — H401123 Primary open-angle glaucoma, left eye, severe stage: Secondary | ICD-10-CM | POA: Diagnosis not present

## 2019-10-24 DIAGNOSIS — K219 Gastro-esophageal reflux disease without esophagitis: Secondary | ICD-10-CM | POA: Diagnosis not present

## 2019-10-24 DIAGNOSIS — I10 Essential (primary) hypertension: Secondary | ICD-10-CM | POA: Diagnosis not present

## 2019-10-24 DIAGNOSIS — Z125 Encounter for screening for malignant neoplasm of prostate: Secondary | ICD-10-CM | POA: Diagnosis not present

## 2019-10-24 DIAGNOSIS — K922 Gastrointestinal hemorrhage, unspecified: Secondary | ICD-10-CM | POA: Diagnosis not present

## 2019-10-24 DIAGNOSIS — Z13220 Encounter for screening for lipoid disorders: Secondary | ICD-10-CM | POA: Diagnosis not present

## 2019-10-24 DIAGNOSIS — Z0000 Encounter for general adult medical examination without abnormal findings: Secondary | ICD-10-CM | POA: Diagnosis not present

## 2019-10-24 DIAGNOSIS — M2141 Flat foot [pes planus] (acquired), right foot: Secondary | ICD-10-CM | POA: Diagnosis not present

## 2019-10-24 DIAGNOSIS — J309 Allergic rhinitis, unspecified: Secondary | ICD-10-CM | POA: Diagnosis not present

## 2019-11-30 DIAGNOSIS — H401123 Primary open-angle glaucoma, left eye, severe stage: Secondary | ICD-10-CM | POA: Diagnosis not present

## 2019-11-30 DIAGNOSIS — H401112 Primary open-angle glaucoma, right eye, moderate stage: Secondary | ICD-10-CM | POA: Diagnosis not present

## 2019-11-30 DIAGNOSIS — H2513 Age-related nuclear cataract, bilateral: Secondary | ICD-10-CM | POA: Diagnosis not present

## 2019-11-30 DIAGNOSIS — H25013 Cortical age-related cataract, bilateral: Secondary | ICD-10-CM | POA: Diagnosis not present

## 2019-12-14 DIAGNOSIS — K625 Hemorrhage of anus and rectum: Secondary | ICD-10-CM | POA: Diagnosis not present

## 2019-12-14 DIAGNOSIS — K219 Gastro-esophageal reflux disease without esophagitis: Secondary | ICD-10-CM | POA: Diagnosis not present

## 2019-12-14 DIAGNOSIS — R1319 Other dysphagia: Secondary | ICD-10-CM | POA: Diagnosis not present

## 2019-12-15 DIAGNOSIS — N5201 Erectile dysfunction due to arterial insufficiency: Secondary | ICD-10-CM | POA: Diagnosis not present

## 2019-12-15 DIAGNOSIS — N200 Calculus of kidney: Secondary | ICD-10-CM | POA: Diagnosis not present

## 2019-12-15 DIAGNOSIS — R350 Frequency of micturition: Secondary | ICD-10-CM | POA: Diagnosis not present

## 2019-12-15 DIAGNOSIS — N401 Enlarged prostate with lower urinary tract symptoms: Secondary | ICD-10-CM | POA: Diagnosis not present

## 2020-01-24 DIAGNOSIS — Z1159 Encounter for screening for other viral diseases: Secondary | ICD-10-CM | POA: Diagnosis not present

## 2020-02-02 ENCOUNTER — Encounter: Payer: Self-pay | Admitting: Podiatry

## 2020-02-02 ENCOUNTER — Ambulatory Visit (INDEPENDENT_AMBULATORY_CARE_PROVIDER_SITE_OTHER): Payer: Medicare Other | Admitting: Podiatry

## 2020-02-02 ENCOUNTER — Other Ambulatory Visit: Payer: Self-pay

## 2020-02-02 DIAGNOSIS — Q666 Other congenital valgus deformities of feet: Secondary | ICD-10-CM

## 2020-02-03 ENCOUNTER — Encounter: Payer: Self-pay | Admitting: Podiatry

## 2020-02-03 NOTE — Progress Notes (Signed)
Subjective:  Patient ID: Mitchell Pennington, male    DOB: 1951/03/16,  MRN: [id number]  Chief Complaint  Patient presents with  . Foot Orthotics    Patient presents today to discuss new orthotics and flat feet.  He denies any problems with feet at the moment, but said one of his orthotics has cracked    69 y.o. male presents with the above complaint.  Patient presents with complaint of bilateral flat feet.  Patient states has been wearing orthotics for many years.  But he started to wear out.  He would like to get new pair of orthotics as his flatfoot started causing pain without orthotics.  He states that he has not seen anyone else prior to seeing me.  He would like to discuss any other treatment options that are available.  He denies any other acute complaints.   Review of Systems: Negative except as noted in the HPI. Denies N/V/F/Ch.  Past Medical History:  Diagnosis Date  . Angio-edema   . Hypertension   . Urticaria     Current Outpatient Medications:  .  amLODipine (NORVASC) 2.5 MG tablet, , Disp: , Rfl:  .  dorzolamide-timolol (COSOPT) 22.3-6.8 MG/ML ophthalmic solution, Apply to eye., Disp: , Rfl:  .  latanoprost (XALATAN) 0.005 % ophthalmic solution, Apply to eye., Disp: , Rfl:  .  aspirin EC 81 MG tablet, Take 81 mg by mouth daily., Disp: , Rfl:  .  aspirin-acetaminophen-caffeine (EXCEDRIN MIGRAINE) 250-250-65 MG tablet, Take 1 tablet by mouth every 6 (six) hours as needed for headache., Disp: , Rfl:  .  atenolol (TENORMIN) 25 MG tablet, Take 1 tablet (25 mg total) by mouth daily., Disp: 30 tablet, Rfl: 0 .  diphenhydramine-acetaminophen (TYLENOL PM) 25-500 MG TABS tablet, Take 1 tablet by mouth every 6 (six) hours as needed (hives/itching)., Disp: , Rfl:  .  EPINEPHrine 0.3 mg/0.3 mL IJ SOAJ injection, , Disp: , Rfl:  .  hydrocortisone cream 1 %, Apply 1 application topically See admin instructions. Apply topically after shaving - approximately 3 times week, Disp: , Rfl:   .  loratadine (CLARITIN) 10 MG tablet, Take 10 mg by mouth daily., Disp: , Rfl:  .  montelukast (SINGULAIR) 10 MG tablet, Take 10 mg by mouth daily as needed (seasonal allergies)., Disp: , Rfl:  .  neomycin-bacitracin-polymyxin (NEOSPORIN) OINT, Apply 1 application topically See admin instructions. Apply topically after shaving - approximately three times week, Disp: , Rfl:  .  pantoprazole (PROTONIX) 40 MG tablet, Take 40 mg by mouth daily., Disp: , Rfl:  .  predniSONE (DELTASONE) 10 MG tablet, Take 1 tablet (10 mg total) by mouth daily., Disp: 30 tablet, Rfl: 0 .  tadalafil (CIALIS) 5 MG tablet, Take 5 mg by mouth at bedtime. , Disp: , Rfl:  .  triamcinolone cream (KENALOG) 0.1 %, Apply 1 application topically 2 (two) times daily as needed (rash/itching). , Disp: , Rfl:  .  vitamin B-12 (CYANOCOBALAMIN) 1000 MCG tablet, Take 2,000 mcg by mouth daily., Disp: , Rfl:   Social History   Tobacco Use  Smoking Status Never Smoker  Smokeless Tobacco Never Used    Allergies  Allergen Reactions  . Angiotensin Receptor Blockers     ACE-induced angioedema  . Contrast Media [Iodinated Diagnostic Agents] Shortness Of Breath  . Lisinopril Anaphylaxis    Angioedema   Objective:  There were no vitals filed for this visit. There is no height or weight on file to calculate BMI. Constitutional Well developed. Well nourished.  Vascular Dorsalis pedis pulses palpable bilaterally. Posterior tibial pulses palpable bilaterally. Capillary refill normal to all digits.  No cyanosis or clubbing noted. Pedal hair growth normal.  Neurologic Normal speech. Oriented to person, place, and time. Epicritic sensation to light touch grossly present bilaterally.  Dermatologic Nails well groomed and normal in appearance. No open wounds. No skin lesions.  Orthopedic:  Gait examination shows there is calcaneal valgus to many toe signs present not able to recruit the arch with dorsiflexion of the hallux.  Patient  unable to perform single and double heel raises.  Pes planovalgus valgus foot structure noted.   Radiographs: None Assessment:   1. Pes planovalgus    Plan:  Patient was evaluated and treated and all questions answered.  Bilateral pes planovalgus -I explained to the patient the etiology of pes planovalgus and various treatment options were discussed.  Patient will benefit from custom-made orthotics to help control the hindfoot motion and support the arch of the foot.  Patient's previous orthotics are a little bit worn out.  I discussed with the patient the financial consideration of the orthotics as well.  He will be scheduled to see [facility] for custom-made orthotics.  No follow-ups on file.

## 2020-03-07 ENCOUNTER — Other Ambulatory Visit: Payer: Medicare Other | Admitting: Orthotics

## 2020-03-07 DIAGNOSIS — Z23 Encounter for immunization: Secondary | ICD-10-CM | POA: Diagnosis not present

## 2020-10-03 DIAGNOSIS — H401112 Primary open-angle glaucoma, right eye, moderate stage: Secondary | ICD-10-CM | POA: Diagnosis not present

## 2020-10-03 DIAGNOSIS — H401123 Primary open-angle glaucoma, left eye, severe stage: Secondary | ICD-10-CM | POA: Diagnosis not present

## 2020-10-03 DIAGNOSIS — H25013 Cortical age-related cataract, bilateral: Secondary | ICD-10-CM | POA: Diagnosis not present

## 2020-10-03 DIAGNOSIS — H2513 Age-related nuclear cataract, bilateral: Secondary | ICD-10-CM | POA: Diagnosis not present

## 2020-10-26 DIAGNOSIS — Z136 Encounter for screening for cardiovascular disorders: Secondary | ICD-10-CM | POA: Diagnosis not present

## 2020-10-26 DIAGNOSIS — K922 Gastrointestinal hemorrhage, unspecified: Secondary | ICD-10-CM | POA: Diagnosis not present

## 2020-10-26 DIAGNOSIS — Z125 Encounter for screening for malignant neoplasm of prostate: Secondary | ICD-10-CM | POA: Diagnosis not present

## 2020-10-26 DIAGNOSIS — K219 Gastro-esophageal reflux disease without esophagitis: Secondary | ICD-10-CM | POA: Diagnosis not present

## 2020-10-26 DIAGNOSIS — Z13220 Encounter for screening for lipoid disorders: Secondary | ICD-10-CM | POA: Diagnosis not present

## 2020-10-26 DIAGNOSIS — J309 Allergic rhinitis, unspecified: Secondary | ICD-10-CM | POA: Diagnosis not present

## 2020-10-26 DIAGNOSIS — I10 Essential (primary) hypertension: Secondary | ICD-10-CM | POA: Diagnosis not present

## 2020-10-26 DIAGNOSIS — Z0000 Encounter for general adult medical examination without abnormal findings: Secondary | ICD-10-CM | POA: Diagnosis not present

## 2020-10-26 DIAGNOSIS — M2141 Flat foot [pes planus] (acquired), right foot: Secondary | ICD-10-CM | POA: Diagnosis not present

## 2020-10-30 DIAGNOSIS — I10 Essential (primary) hypertension: Secondary | ICD-10-CM | POA: Diagnosis not present

## 2020-10-30 DIAGNOSIS — Z0000 Encounter for general adult medical examination without abnormal findings: Secondary | ICD-10-CM | POA: Diagnosis not present

## 2020-10-30 DIAGNOSIS — Z125 Encounter for screening for malignant neoplasm of prostate: Secondary | ICD-10-CM | POA: Diagnosis not present

## 2020-10-30 DIAGNOSIS — K219 Gastro-esophageal reflux disease without esophagitis: Secondary | ICD-10-CM | POA: Diagnosis not present

## 2020-10-30 DIAGNOSIS — J309 Allergic rhinitis, unspecified: Secondary | ICD-10-CM | POA: Diagnosis not present

## 2020-11-14 DIAGNOSIS — H401112 Primary open-angle glaucoma, right eye, moderate stage: Secondary | ICD-10-CM | POA: Diagnosis not present

## 2020-11-14 DIAGNOSIS — H401123 Primary open-angle glaucoma, left eye, severe stage: Secondary | ICD-10-CM | POA: Diagnosis not present

## 2021-03-13 DIAGNOSIS — H401112 Primary open-angle glaucoma, right eye, moderate stage: Secondary | ICD-10-CM | POA: Diagnosis not present

## 2021-03-13 DIAGNOSIS — H25013 Cortical age-related cataract, bilateral: Secondary | ICD-10-CM | POA: Diagnosis not present

## 2021-03-13 DIAGNOSIS — H401123 Primary open-angle glaucoma, left eye, severe stage: Secondary | ICD-10-CM | POA: Diagnosis not present

## 2021-03-13 DIAGNOSIS — H2513 Age-related nuclear cataract, bilateral: Secondary | ICD-10-CM | POA: Diagnosis not present

## 2021-05-10 DIAGNOSIS — R3912 Poor urinary stream: Secondary | ICD-10-CM | POA: Diagnosis not present

## 2021-05-10 DIAGNOSIS — N401 Enlarged prostate with lower urinary tract symptoms: Secondary | ICD-10-CM | POA: Diagnosis not present

## 2021-05-10 DIAGNOSIS — N200 Calculus of kidney: Secondary | ICD-10-CM | POA: Diagnosis not present

## 2021-05-13 ENCOUNTER — Other Ambulatory Visit: Payer: Self-pay | Admitting: Urology

## 2021-05-22 ENCOUNTER — Encounter (HOSPITAL_BASED_OUTPATIENT_CLINIC_OR_DEPARTMENT_OTHER): Payer: Self-pay | Admitting: Urology

## 2021-05-22 NOTE — Progress Notes (Signed)
05/22/2021 12:53 PM Pre procedure call completed. Pt. Updated on date and time of arrival 05/27/21 at 0800. Times for NPO MN and clear liquid consumption until 0600 reviewed. Pt. Allergies, medical hx and medication list reviewed. Medications ok to take day of surgery and those to hold prior to surgery reviewed (hold ASA and MV after Friday). Ride secured for day of surgery. (Pt. Wfie). Questions and concerns addressed by patient. Pt. Verbalized understanding of all instructions.  Auriah Hollings, Arville Lime

## 2021-05-27 ENCOUNTER — Ambulatory Visit (HOSPITAL_BASED_OUTPATIENT_CLINIC_OR_DEPARTMENT_OTHER)
Admission: RE | Admit: 2021-05-27 | Discharge: 2021-05-27 | Disposition: A | Discharge status: Home / Self Care | Payer: Medicare Other | Attending: Urology | Admitting: Urology

## 2021-05-27 ENCOUNTER — Encounter (HOSPITAL_BASED_OUTPATIENT_CLINIC_OR_DEPARTMENT_OTHER): Payer: Self-pay | Admitting: Urology

## 2021-05-27 ENCOUNTER — Ambulatory Visit (HOSPITAL_COMMUNITY): Payer: Medicare Other

## 2021-05-27 ENCOUNTER — Encounter (HOSPITAL_BASED_OUTPATIENT_CLINIC_OR_DEPARTMENT_OTHER)
Admission: RE | Disposition: A | Discharge status: Home / Self Care | Payer: Self-pay | Source: Home / Self Care | Attending: Urology

## 2021-05-27 ENCOUNTER — Other Ambulatory Visit: Payer: Self-pay

## 2021-05-27 DIAGNOSIS — Z6835 Body mass index (BMI) 35.0-35.9, adult: Secondary | ICD-10-CM | POA: Diagnosis not present

## 2021-05-27 DIAGNOSIS — N200 Calculus of kidney: Secondary | ICD-10-CM | POA: Diagnosis not present

## 2021-05-27 DIAGNOSIS — I878 Other specified disorders of veins: Secondary | ICD-10-CM | POA: Diagnosis not present

## 2021-05-27 DIAGNOSIS — E669 Obesity, unspecified: Secondary | ICD-10-CM | POA: Insufficient documentation

## 2021-05-27 DIAGNOSIS — I10 Essential (primary) hypertension: Secondary | ICD-10-CM | POA: Diagnosis not present

## 2021-05-27 HISTORY — PX: EXTRACORPOREAL SHOCK WAVE LITHOTRIPSY: SHX1557

## 2021-05-27 HISTORY — DX: Personal history of urinary calculi: Z87.442

## 2021-05-27 SURGERY — LITHOTRIPSY, ESWL
Anesthesia: LOCAL | Laterality: Right

## 2021-05-27 MED ORDER — SENNOSIDES-DOCUSATE SODIUM 8.6-50 MG PO TABS: 1.0000 | ORAL_TABLET | Freq: Two times a day (BID) | ORAL | 0 refills | Status: DC

## 2021-05-27 MED ORDER — OXYCODONE-ACETAMINOPHEN 5-325 MG PO TABS: 1.0000 | ORAL_TABLET | Freq: Four times a day (QID) | ORAL | 0 refills | Status: DC | PRN

## 2021-05-27 MED ORDER — SODIUM CHLORIDE 0.9 % IV SOLN: INTRAVENOUS | Status: DC

## 2021-05-27 MED ORDER — ONDANSETRON 8 MG PO TBDP: 8.0000 mg | ORAL_TABLET | Freq: Three times a day (TID) | ORAL | 0 refills | Status: DC | PRN

## 2021-05-27 MED ORDER — DIAZEPAM 5 MG PO TABS
ORAL_TABLET | ORAL | Status: AC
  Filled 2021-05-27: qty 2

## 2021-05-27 MED ORDER — DIPHENHYDRAMINE HCL 25 MG PO CAPS
25.0000 mg | ORAL_CAPSULE | Freq: Once | ORAL | Status: AC
  Administered 2021-05-27: 25 mg via ORAL

## 2021-05-27 MED ORDER — DIPHENHYDRAMINE HCL 25 MG PO CAPS
ORAL_CAPSULE | ORAL | Status: AC
  Filled 2021-05-27: qty 1

## 2021-05-27 MED ORDER — CIPROFLOXACIN HCL 500 MG PO TABS
500.0000 mg | ORAL_TABLET | Freq: Once | ORAL | Status: AC
  Administered 2021-05-27: 500 mg via ORAL

## 2021-05-27 MED ORDER — TAMSULOSIN HCL 0.4 MG PO CAPS: 0.4000 mg | ORAL_CAPSULE | Freq: Every day | ORAL | 0 refills | Status: DC

## 2021-05-27 MED ORDER — DIAZEPAM 5 MG PO TABS
10.0000 mg | ORAL_TABLET | Freq: Once | ORAL | Status: AC
  Administered 2021-05-27: 10 mg via ORAL

## 2021-05-27 MED ORDER — CIPROFLOXACIN HCL 500 MG PO TABS
ORAL_TABLET | ORAL | Status: AC
  Filled 2021-05-27: qty 1

## 2021-05-27 NOTE — H&P (Signed)
Mitchell Pennington is an 71 y.o. male.    Chief Complaint: Pre-Op RIGHT Shockwave Lithotrispy  HPI:   1 - RIGHT Renal Pelvis Stone - 1.2cm Rt renal pelvis stone slowly enlarging on imaging x several. At level of T12 tip.   Today "Mitchell Pennington" is seen to proceed with RIGHT shockwave lithotripsy. No interval fevers. Most recent UA without infectious parameters.   Past Medical History:  Diagnosis Date   Angio-edema    History of kidney stones    Hypertension    Urticaria     History reviewed. No pertinent surgical history.  History reviewed. No pertinent family history. Social History:  reports that he has never smoked. He has never used smokeless tobacco. He reports that he does not drink alcohol and does not use drugs.  Allergies:  Allergies  Allergen Reactions   Angiotensin Receptor Blockers     ACE-induced angioedema   Contrast Media [Iodinated Contrast Media] Shortness Of Breath   Lisinopril Anaphylaxis    Angioedema    No medications prior to admission.    No results found for this or any previous visit (from the past 48 hour(s)). No results found.  Review of Systems  Constitutional:  Negative for chills and fever.  All other systems reviewed and are negative.  There were no vitals taken for this visit. Physical Exam Vitals reviewed.  HENT:     Head: Normocephalic.     Nose: Nose normal.  Eyes:     Pupils: Pupils are equal, round, and reactive to light.  Abdominal:     Comments: Significant truncal obesity.   Genitourinary:    Comments: No CVAT at present Musculoskeletal:        General: Normal range of motion.     Cervical back: Normal range of motion.  Neurological:     Mental Status: He is alert.  Psychiatric:        Mood and Affect: Mood normal.     Assessment/Plan  Proceed as planned with RIGHT shockwaave lithotripsy. Risks, benefits, alternatives, expected peri-op course discussed.   Alexis Frock, MD 05/27/2021, 7:42 AM

## 2021-05-27 NOTE — Discharge Instructions (Addendum)
1 - You may have urinary urgency (bladder spasms), pass small stone fragments, and bloody urine on / off for up to 2 weeks. This is normal.  2 - Call MD or go to ER for fever >102, severe pain / nausea / vomiting not relieved by medications, or acute change in medical status  Post Anesthesia Home Care Instructions  Activity: Get plenty of rest for the remainder of the day. A responsible adult should stay with you for 24 hours following the procedure.  For the next 24 hours, DO NOT: -Drive a car -Paediatric nurse -Drink alcoholic beverages -Take any medication unless instructed by your physician -Make any legal decisions or sign important papers.  Meals: Start with liquid foods such as gelatin or soup. Progress to regular foods as tolerated. Avoid greasy, spicy, heavy foods. If nausea and/or vomiting occur, drink only clear liquids until the nausea and/or vomiting subsides. Call your physician if vomiting continues.  Special Instructions/Symptoms: Your throat may feel dry or sore from the anesthesia or the breathing tube placed in your throat during surgery. If this causes discomfort, gargle with warm salt water. The discomfort should disappear within 24 hours.  If you had a scopolamine patch placed behind your ear for the management of post- operative nausea and/or vomiting:  1. The medication in the patch is effective for 72 hours, after which it should be removed.  Wrap patch in a tissue and discard in the trash. Wash hands thoroughly with soap and water. 2. You may remove the patch earlier than 72 hours if you experience unpleasant side effects which may include dry mouth, dizziness or visual disturbances. 3. Avoid touching the patch. Wash your hands with soap and water after contact with the patch.

## 2021-05-27 NOTE — Brief Op Note (Signed)
05/27/2021  11:11 AM  PATIENT:  Mitchell Pennington  71 y.o. male  PRE-OPERATIVE DIAGNOSIS:  RIGHT RENAL STONE  POST-OPERATIVE DIAGNOSIS:  * No post-op diagnosis entered *  PROCEDURE:  Procedure(s): EXTRACORPOREAL SHOCK WAVE LITHOTRIPSY (ESWL) (Right)  SURGEON:  Surgeon(s) and Role:    Alexis Frock, MD - Primary  PHYSICIAN ASSISTANT:   ASSISTANTS: none   ANESTHESIA:   MAC  EBL:  minmal    BLOOD ADMINISTERED:none  DRAINS: none   LOCAL MEDICATIONS USED:  NONE  SPECIMEN:  No Specimen  DISPOSITION OF SPECIMEN:  N/A  COUNTS:  YES  TOURNIQUET:  * No tourniquets in log *  DICTATION: .Note written in paper chart  PLAN OF CARE: Discharge to home after PACU  PATIENT DISPOSITION:  Short Stay   Delay start of Pharmacological VTE agent (>24hrs) due to surgical blood loss or risk of bleeding: not applicable

## 2021-05-28 ENCOUNTER — Encounter (HOSPITAL_BASED_OUTPATIENT_CLINIC_OR_DEPARTMENT_OTHER): Payer: Self-pay | Admitting: Urology

## 2021-06-10 DIAGNOSIS — N200 Calculus of kidney: Secondary | ICD-10-CM | POA: Diagnosis not present

## 2021-11-01 DIAGNOSIS — I10 Essential (primary) hypertension: Secondary | ICD-10-CM | POA: Diagnosis not present

## 2021-11-01 DIAGNOSIS — Z125 Encounter for screening for malignant neoplasm of prostate: Secondary | ICD-10-CM | POA: Diagnosis not present

## 2021-11-04 DIAGNOSIS — H401123 Primary open-angle glaucoma, left eye, severe stage: Secondary | ICD-10-CM | POA: Diagnosis not present

## 2021-11-04 DIAGNOSIS — H401112 Primary open-angle glaucoma, right eye, moderate stage: Secondary | ICD-10-CM | POA: Diagnosis not present

## 2021-11-04 DIAGNOSIS — H25812 Combined forms of age-related cataract, left eye: Secondary | ICD-10-CM | POA: Diagnosis not present

## 2021-11-04 DIAGNOSIS — H25811 Combined forms of age-related cataract, right eye: Secondary | ICD-10-CM | POA: Diagnosis not present

## 2021-11-05 DIAGNOSIS — I10 Essential (primary) hypertension: Secondary | ICD-10-CM | POA: Diagnosis not present

## 2021-11-05 DIAGNOSIS — K219 Gastro-esophageal reflux disease without esophagitis: Secondary | ICD-10-CM | POA: Diagnosis not present

## 2021-11-05 DIAGNOSIS — M21371 Foot drop, right foot: Secondary | ICD-10-CM | POA: Diagnosis not present

## 2021-11-05 DIAGNOSIS — Z0000 Encounter for general adult medical examination without abnormal findings: Secondary | ICD-10-CM | POA: Diagnosis not present

## 2021-11-05 DIAGNOSIS — R011 Cardiac murmur, unspecified: Secondary | ICD-10-CM | POA: Diagnosis not present

## 2021-11-05 DIAGNOSIS — J309 Allergic rhinitis, unspecified: Secondary | ICD-10-CM | POA: Diagnosis not present

## 2021-11-05 DIAGNOSIS — R972 Elevated prostate specific antigen [PSA]: Secondary | ICD-10-CM | POA: Diagnosis not present

## 2021-11-05 DIAGNOSIS — R7301 Impaired fasting glucose: Secondary | ICD-10-CM | POA: Diagnosis not present

## 2021-11-05 DIAGNOSIS — Z6836 Body mass index (BMI) 36.0-36.9, adult: Secondary | ICD-10-CM | POA: Diagnosis not present

## 2021-11-27 ENCOUNTER — Ambulatory Visit (INDEPENDENT_AMBULATORY_CARE_PROVIDER_SITE_OTHER): Payer: Medicare Other | Admitting: Neurology

## 2021-11-27 ENCOUNTER — Encounter: Payer: Self-pay | Admitting: Neurology

## 2021-11-27 VITALS — BP 168/84 | HR 50 | Ht 71.0 in | Wt 252.0 lb

## 2021-11-27 DIAGNOSIS — Z0000 Encounter for general adult medical examination without abnormal findings: Secondary | ICD-10-CM

## 2021-11-27 DIAGNOSIS — I10 Essential (primary) hypertension: Secondary | ICD-10-CM | POA: Diagnosis not present

## 2021-11-27 NOTE — Patient Instructions (Signed)
Continue current medications  Start exercising at lest 20 minutes a day 5 days a week, or 1 hour a day 3 days a week.  Follow up with PCP and return as needed

## 2021-11-27 NOTE — Progress Notes (Signed)
[facility]  PATIENT: Mitchell Pennington DOB: Mar 08, 1951  REQUESTING CLINICIAN: Lawerance Cruel, MD HISTORY FROM: Patient  REASON FOR VISIT: Falls   HISTORICAL  CHIEF COMPLAINT:  Chief Complaint  Patient presents with   New Patient (Initial Visit)    Room 12,  Right foot drop, sx started 3 months, fell 3 weeks ago     HISTORY OF PRESENT ILLNESS:  This is a 71 year old gentleman past medical history of hypertension, history of kidney stone who is presenting for falls and complaint of right foot weakness.  Patient report 3 months ago he fell and again had another fall about 3 weeks ago.  He was walking on the sidewalk and going over the curb and his right foot got caught.  He denies any head injury from the fall.  Denies any other symptoms.  He does not denies any trouble with balance, no dizziness, no abnormal gait.  He is compliant with his medication.   He is a retired Environmental health practitioner.    OTHER MEDICAL CONDITIONS: Hypertension    REVIEW OF SYSTEMS: Full 14 system review of systems performed and negative with exception of: As noted in the HPI   ALLERGIES: Allergies  Allergen Reactions   Angiotensin Receptor Blockers     ACE-induced angioedema   Contrast Media [Iodinated Contrast Media] Shortness Of Breath   Lisinopril Anaphylaxis    Angioedema    HOME MEDICATIONS: Outpatient Medications Prior to Visit  Medication Sig Dispense Refill   amLODipine (NORVASC) 2.5 MG tablet      aspirin EC 81 MG tablet Take 81 mg by mouth daily.     aspirin-acetaminophen-caffeine (EXCEDRIN MIGRAINE) 250-250-65 MG tablet Take 1 tablet by mouth every 6 (six) hours as needed for headache.     atenolol (TENORMIN) 25 MG tablet Take 1 tablet (25 mg total) by mouth daily. 30 tablet 0   diphenhydramine-acetaminophen (TYLENOL PM) 25-500 MG TABS tablet Take 1 tablet by mouth every 6 (six) hours as needed (hives/itching).     hydrocortisone cream 1 % Apply 1 application  topically See admin instructions. Apply topically after shaving - approximately 3 times week     loratadine (CLARITIN) 10 MG tablet Take 10 mg by mouth daily.     montelukast (SINGULAIR) 10 MG tablet Take 10 mg by mouth daily as needed (seasonal allergies).     neomycin-bacitracin-polymyxin (NEOSPORIN) OINT Apply 1 application topically See admin instructions. Apply topically after shaving - approximately three times week     pantoprazole (PROTONIX) 40 MG tablet Take 40 mg by mouth daily.     tadalafil (CIALIS) 5 MG tablet Take 5 mg by mouth at bedtime.     triamcinolone cream (KENALOG) 0.1 % Apply 1 application topically 2 (two) times daily as needed (rash/itching).      vitamin B-12 (CYANOCOBALAMIN) 1000 MCG tablet Take 2,000 mcg by mouth daily.     EPINEPHrine 0.3 mg/0.3 mL IJ SOAJ injection      ondansetron (ZOFRAN-ODT) 8 MG disintegrating tablet Take 1 tablet (8 mg total) by mouth every 8 (eight) hours as needed for nausea or vomiting. 10 tablet 0   oxyCODONE-acetaminophen (PERCOCET) 5-325 MG tablet Take 1 tablet by mouth every 6 (six) hours as needed for severe pain or moderate pain (after lithotripsy). 15 tablet 0   senna-docusate (SENOKOT-S) 8.6-50 MG tablet Take 1 tablet by mouth 2 (two) times daily. While taking strong pain meds to prevent constipation 10 tablet 0   tamsulosin (FLOMAX) 0.4 MG CAPS capsule  Take 1 capsule (0.4 mg total) by mouth daily. To help pass fragments after lithotripsy 14 capsule 0   No facility-administered medications prior to visit.    PAST MEDICAL HISTORY: Past Medical History:  Diagnosis Date   Angio-edema    History of kidney stones    Hypertension    Urticaria     PAST SURGICAL HISTORY: Past Surgical History:  Procedure Laterality Date   EXTRACORPOREAL SHOCK WAVE LITHOTRIPSY Right 05/27/2021   Procedure: EXTRACORPOREAL SHOCK WAVE LITHOTRIPSY (ESWL);  Surgeon: Alexis Frock, MD;  Location: [facility];  Service: Urology;  Laterality:  Right;    FAMILY HISTORY: History reviewed. No pertinent family history.  SOCIAL HISTORY: Social History   Socioeconomic History   Marital status: Married    Spouse name: Not on file   Number of children: Not on file   Years of education: Not on file   Highest education level: Not on file  Occupational History   Not on file  Tobacco Use   Smoking status: Never   Smokeless tobacco: Never  Vaping Use   Vaping Use: Never used  Substance and Sexual Activity   Alcohol use: No   Drug use: No   Sexual activity: Never  Other Topics Concern   Not on file  Social History Narrative   Not on file   Social Determinants of Health   Financial Resource Strain: Not on file  Food Insecurity: Not on file  Transportation Needs: Not on file  Physical Activity: Not on file  Stress: Not on file  Social Connections: Not on file  Intimate Partner Violence: Not on file    PHYSICAL EXAM  GENERAL EXAM/CONSTITUTIONAL: Vitals:  Vitals:   11/27/21 0929  BP: (!) 168/84  Pulse: (!) 50  Weight: 252 lb (114.3 kg)  Height: '5\' 11"'$  (1.803 m)   Body mass index is 35.15 kg/m. Wt Readings from Last 3 Encounters:  11/27/21 252 lb (114.3 kg)  05/27/21 255 lb (115.7 kg)  09/21/17 259 lb 9.6 oz (117.8 kg)   Patient is in no distress; well developed, nourished and groomed; neck is supple  EYES: Pupils round and reactive to light, Visual fields full to confrontation, Extraocular movements intacts,   MUSCULOSKELETAL: Gait, strength, tone, movements noted in Neurologic exam below  NEUROLOGIC: MENTAL STATUS:      No data to display         awake, alert, oriented to person, place and time recent and remote memory intact normal attention and concentration language fluent, comprehension intact, naming intact fund of knowledge appropriate  CRANIAL NERVE:  2nd, 3rd, 4th, 6th - pupils equal and reactive to light, visual fields full to confrontation, extraocular muscles intact, no  nystagmus 5th - facial sensation symmetric 7th - facial strength symmetric 8th - hearing intact 9th - palate elevates symmetrically, uvula midline 11th - shoulder shrug symmetric 12th - tongue protrusion midline  MOTOR:  normal bulk and tone, full strength in the BUE, BLE. He is able to toe walk and heel walk. Normal foot eversion and inversion bilaterally.   SENSORY:  normal and symmetric to light touch, pinprick, temperature, mild decrease vibration in the bilateral great toe  COORDINATION:  finger-nose-finger, fine finger movements normal  REFLEXES:  deep tendon reflexes present and symmetric  GAIT/STATION:  Normal, he is able to toe walk and heel walk    DIAGNOSTIC DATA (LABS, IMAGING, TESTING) - I reviewed patient records, labs, notes, testing and imaging myself where available.  Lab Results  Component  Value Date   WBC 11.5 (H) 08/26/2017   HGB 11.3 (L) 08/26/2017   HCT 34.0 (L) 08/26/2017   MCV 88.3 08/26/2017   PLT 232 08/26/2017      Component Value Date/Time   NA 138 08/26/2017 0238   K 4.0 08/26/2017 0238   CL 106 08/26/2017 0238   CO2 23 08/26/2017 0238   GLUCOSE 131 (H) 08/26/2017 0238   BUN 20 08/26/2017 0238   CREATININE 0.77 08/26/2017 0238   CALCIUM 8.2 (L) 08/26/2017 0238   PROT 7.4 06/01/2017 1338   ALBUMIN 4.0 06/01/2017 1338   AST 23 06/01/2017 1338   ALT 29 06/01/2017 1338   ALKPHOS 62 06/01/2017 1338   BILITOT 0.7 06/01/2017 1338   GFRNONAA >60 08/26/2017 0238   GFRAA >60 08/26/2017 0238   No results found for: "CHOL", "HDL", "LDLCALC", "LDLDIRECT", "TRIG", "CHOLHDL" No results found for: "HGBA1C" No results found for: "VITAMINB12" No results found for: "TSH"    ASSESSMENT AND PLAN  71 y.o. year old male with history of hypertension and kidney stones who is presenting after 2 falls.  Patient reported the falls were mechanical fall.  On exam today he does not have any evidence of weakness in the lower extremities, there are no  evidence of foot drop.  He does have very mild decreased vibratory sense at the bilateral great toe otherwise after rest of the neuro exam is normal.  His gait is normal, he is able to toe walk and heel walk.  Advised him to continue with his current medications, and to start exercising, discussed exercise 20 minutes a day 5 days a week or 1 hour a day 3 days a week.  He is comfortable with plans. Follow up as needed.    1. Normal neurological exam   2. Hypertension, unspecified type      Patient Instructions  Continue current medications  Start exercising at lest 20 minutes a day 5 days a week, or 1 hour a day 3 days a week.  Follow up with PCP and return as needed   No orders of the defined types were placed in this encounter.   No orders of the defined types were placed in this encounter.   Return if symptoms worsen or fail to improve.    Alric Ran, MD 11/27/2021, 10:02 AM  [facility] [address] [place], [place] [address] [phone]

## 2022-01-29 NOTE — Progress Notes (Unsigned)
Cardiology Office Note:    Date:  01/30/2022   ID:  Mitchell Pennington, DOB 06-Feb-1951, MRN [id number]  PCP:  Lawerance Cruel, MD   [facility] Cardiologist:  Lenna Sciara, MD Referring MD: Lawerance Cruel, MD   Chief Complaint/Reason for Referral: Murmur  ASSESSMENT:    1. Murmur   2. Primary hypertension   3. Body mass index (BMI) of 36.0 to 36.9     PLAN:    In order of problems listed above: 1.  Murmur: We will obtain echocardiogram to evaluate further.  I suspect the patient has developed aortic sclerosis or mild aortic stenosis.   The patient has no coronary artery disease, peripheral vascular disease, history of diabetes, or history of stroke.  We will discontinue aspirin. 2.  Hypertension: His blood pressure is well controlled on his current regimen. 3.  Elevated BMI: Continue diet and exercise.             Dispo:  Return in about 9 months (around 10/31/2022).      Medication Adjustments/Labs and Tests Ordered: Current medicines are reviewed at length with the patient today.  Concerns regarding medicines are outlined above.  The following changes have been made:     Labs/tests ordered: Orders Placed This Encounter  Procedures   EKG 12-Lead   ECHOCARDIOGRAM COMPLETE    Medication Changes: No orders of the defined types were placed in this encounter.    Current medicines are reviewed at length with the patient today.  The patient does not have concerns regarding medicines.   History of Present Illness:    FOCUSED PROBLEM LIST:   1.  ACE inhibitor induced angioedema 2.  Hypertension 3.  GERD 4.  BMI of 36  The patient is a 71 y.o. male with the indicated medical history here for for an incidentally noted murmur.  The patient was seen by his primary care provider recently.  A murmur was noted.  For this reason he was referred for cardiology consultation.  The patient has been doing relatively well.  He is able to do all of his  activities of daily living without any issues.  At night he sometimes gets a chest pain that is exacerbated by chest wall motion.  He does not get this chest pain when he exerts himself.  He denies any significant shortness of breath, presyncope, syncope, palpitations, paroxysmal nocturnal dyspnea, orthopnea.  He is otherwise well without significant complaints.         Current Medications: Current Meds  Medication Sig   amLODipine (NORVASC) 2.5 MG tablet Take 2.5 mg by mouth daily.   Ascorbic Acid (VITAMIN C) 1000 MG tablet Take 1,000 mg by mouth daily.   aspirin-acetaminophen-caffeine (EXCEDRIN MIGRAINE) 250-250-65 MG tablet Take 1 tablet by mouth every 6 (six) hours as needed for headache.   atenolol (TENORMIN) 25 MG tablet Take 1 tablet (25 mg total) by mouth daily. (Patient taking differently: Take 50 mg by mouth daily.)   diphenhydramine-acetaminophen (TYLENOL PM) 25-500 MG TABS tablet Take 1 tablet by mouth every 6 (six) hours as needed (hives/itching).   DORZOLAMIDE HCL-TIMOLOL MAL OP Place 1 drop into both eyes daily.   fexofenadine (ALLEGRA) 180 MG tablet Take 180 mg by mouth as needed for allergies.   hydrocortisone cream 1 % Apply 1 application topically See admin instructions. Apply topically after shaving - approximately 3 times week   latanoprost (XALATAN) 0.005 % ophthalmic solution Place 1 drop into both eyes at bedtime.   loratadine (  CLARITIN) 10 MG tablet Take 10 mg by mouth daily as needed for allergies.   montelukast (SINGULAIR) 10 MG tablet Take 10 mg by mouth daily as needed (seasonal allergies).   neomycin-bacitracin-polymyxin (NEOSPORIN) OINT Apply 1 application topically See admin instructions. Apply topically after shaving - approximately three times week   pantoprazole (PROTONIX) 40 MG tablet Take 40 mg by mouth daily.   tadalafil (CIALIS) 5 MG tablet Take 5 mg by mouth at bedtime.   triamcinolone cream (KENALOG) 0.1 % Apply 1 application topically 2 (two) times  daily as needed (rash/itching).    vitamin B-12 (CYANOCOBALAMIN) 1000 MCG tablet Take 2,000 mcg by mouth daily.   Vitamin E 100 units TABS Take 1 tablet by mouth daily.   [DISCONTINUED] aspirin EC 81 MG tablet Take 81 mg by mouth daily.     Allergies:    Angiotensin receptor blockers, Contrast media [iodinated contrast media], Iodine, and Lisinopril   Social History:   Social History   Tobacco Use   Smoking status: Never   Smokeless tobacco: Never  Vaping Use   Vaping Use: Never used  Substance Use Topics   Alcohol use: No   Drug use: No     Family Hx: History reviewed. No pertinent family history.   Review of Systems:   Please see the history of present illness.    All other systems reviewed and are negative.     EKGs/Labs/Other Test Reviewed:    EKG:  EKG performed 2015 that I personally reviewed demonstrates sinus rhythm with left ventricular hypertrophy; EKG performed today that I personally reviewed demonstrates sinus rhythm and left ventricular hypertrophy with diffuse T wave abnormalities.  Prior CV studies: None available  Other studies Reviewed: Review of the additional studies/records demonstrates: CT abdomen pelvis 2019 without aortic atherosclerosis  Recent Labs: No results found for requested labs within last 365 days.   Recent Lipid Panel No results found for: "CHOL", "TRIG", "HDL", "LDLCALC", "LDLDIRECT"  External labs from August 2023 demonstrate sodium 137, potassium 3.6, BUN 16, creatinine 0.81, LFTs within normal limits  Risk Assessment/Calculations:                Physical Exam:    VS:  BP 126/72 (BP Location: Left Arm, Patient Position: Sitting)   Pulse 61   Ht '5\' 11"'$  (1.803 m)   Wt 251 lb 6.4 oz (114 kg)   SpO2 97%   BMI 35.06 kg/m    Wt Readings from Last 3 Encounters:  01/30/22 251 lb 6.4 oz (114 kg)  11/27/21 252 lb (114.3 kg)  05/27/21 255 lb (115.7 kg)    GENERAL:  No apparent distress, AOx3 HEENT:  No carotid bruits,  +2 carotid impulses, no scleral icterus CAR: RRR 2/6 SEM without gallops, rubs, or thrills RES:  Clear to auscultation bilaterally ABD:  Soft, nontender, nondistended, positive bowel sounds x 4 VASC:  +2 radial pulses, +2 carotid pulses, palpable pedal pulses NEURO:  CN 2-12 grossly intact; motor and sensory grossly intact PSYCH:  No active depression or anxiety EXT:  No edema, ecchymosis, or cyanosis  Signed, Early Osmond, MD  01/30/2022 4:31 PM    [address], [place], [place]  [address] Phone: [phone]; Fax: [phone]   Note:  This document was prepared using Dragon voice recognition software and may include unintentional dictation errors.

## 2022-01-30 ENCOUNTER — Encounter: Payer: Self-pay | Admitting: Internal Medicine

## 2022-01-30 ENCOUNTER — Ambulatory Visit: Payer: Medicare Other | Attending: Internal Medicine | Admitting: Internal Medicine

## 2022-01-30 VITALS — BP 126/72 | HR 61 | Ht 71.0 in | Wt 251.4 lb

## 2022-01-30 DIAGNOSIS — B359 Dermatophytosis, unspecified: Secondary | ICD-10-CM | POA: Insufficient documentation

## 2022-01-30 DIAGNOSIS — Z760 Encounter for issue of repeat prescription: Secondary | ICD-10-CM | POA: Insufficient documentation

## 2022-01-30 DIAGNOSIS — M545 Low back pain, unspecified: Secondary | ICD-10-CM | POA: Insufficient documentation

## 2022-01-30 DIAGNOSIS — D126 Benign neoplasm of colon, unspecified: Secondary | ICD-10-CM | POA: Insufficient documentation

## 2022-01-30 DIAGNOSIS — R011 Cardiac murmur, unspecified: Secondary | ICD-10-CM | POA: Diagnosis not present

## 2022-01-30 DIAGNOSIS — K2100 Gastro-esophageal reflux disease with esophagitis, without bleeding: Secondary | ICD-10-CM | POA: Insufficient documentation

## 2022-01-30 DIAGNOSIS — H52229 Regular astigmatism, unspecified eye: Secondary | ICD-10-CM | POA: Insufficient documentation

## 2022-01-30 DIAGNOSIS — M25579 Pain in unspecified ankle and joints of unspecified foot: Secondary | ICD-10-CM | POA: Insufficient documentation

## 2022-01-30 DIAGNOSIS — I10 Essential (primary) hypertension: Secondary | ICD-10-CM | POA: Diagnosis not present

## 2022-01-30 DIAGNOSIS — M7660 Achilles tendinitis, unspecified leg: Secondary | ICD-10-CM | POA: Insufficient documentation

## 2022-01-30 DIAGNOSIS — Z139 Encounter for screening, unspecified: Secondary | ICD-10-CM | POA: Insufficient documentation

## 2022-01-30 DIAGNOSIS — N400 Enlarged prostate without lower urinary tract symptoms: Secondary | ICD-10-CM | POA: Insufficient documentation

## 2022-01-30 DIAGNOSIS — Z634 Disappearance and death of family member: Secondary | ICD-10-CM | POA: Insufficient documentation

## 2022-01-30 DIAGNOSIS — R7301 Impaired fasting glucose: Secondary | ICD-10-CM | POA: Insufficient documentation

## 2022-01-30 DIAGNOSIS — R7303 Prediabetes: Secondary | ICD-10-CM | POA: Insufficient documentation

## 2022-01-30 DIAGNOSIS — Z6836 Body mass index (BMI) 36.0-36.9, adult: Secondary | ICD-10-CM

## 2022-01-30 DIAGNOSIS — M7989 Other specified soft tissue disorders: Secondary | ICD-10-CM | POA: Insufficient documentation

## 2022-01-30 DIAGNOSIS — B360 Pityriasis versicolor: Secondary | ICD-10-CM | POA: Insufficient documentation

## 2022-01-30 DIAGNOSIS — H524 Presbyopia: Secondary | ICD-10-CM | POA: Insufficient documentation

## 2022-01-30 DIAGNOSIS — H5210 Myopia, unspecified eye: Secondary | ICD-10-CM | POA: Insufficient documentation

## 2022-01-30 DIAGNOSIS — H25019 Cortical age-related cataract, unspecified eye: Secondary | ICD-10-CM | POA: Insufficient documentation

## 2022-01-30 NOTE — Patient Instructions (Signed)
Medication Instructions:  Your physician has recommended you make the following change in your medication:  1.) stop aspirin  *If you need a refill on your cardiac medications before your next appointment, please call your pharmacy*   Lab Work: none   Testing/Procedures: Your physician has requested that you have an echocardiogram. Echocardiography is a painless test that uses sound waves to create images of your heart. It provides your doctor with information about the size and shape of your heart and how well your heart's chambers and valves are working. This procedure takes approximately one hour. There are no restrictions for this procedure. Please do NOT wear cologne, perfume, aftershave, or lotions (deodorant is allowed). Please arrive 15 minutes prior to your appointment time.   Follow-Up: At [facility], you and your health needs are our priority.  As part of our continuing mission to provide you with exceptional heart care, we have created designated Provider Care Teams.  These Care Teams include your primary Cardiologist (physician) and Advanced Practice Providers (APPs -  Physician Assistants and Nurse Practitioners) who all work together to provide you with the care you need, when you need it.  We recommend signing up for the patient portal called "MyChart".  Sign up information is provided on this After Visit Summary.  MyChart is used to connect with patients for Virtual Visits (Telemedicine).  Patients are able to view lab/test results, encounter notes, upcoming appointments, etc.  Non-urgent messages can be sent to your provider as well.   To learn more about what you can do with MyChart, go to [email or web address].    Your next appointment:   9 month(s)  The format for your next appointment:   In Person  Provider:   Lenna Sciara, MD     Important Information About Sugar

## 2022-02-05 DIAGNOSIS — R972 Elevated prostate specific antigen [PSA]: Secondary | ICD-10-CM | POA: Diagnosis not present

## 2022-02-13 DIAGNOSIS — Z23 Encounter for immunization: Secondary | ICD-10-CM | POA: Diagnosis not present

## 2022-02-19 ENCOUNTER — Ambulatory Visit (HOSPITAL_COMMUNITY): Payer: Medicare Other | Attending: Internal Medicine

## 2022-02-19 DIAGNOSIS — R011 Cardiac murmur, unspecified: Secondary | ICD-10-CM | POA: Diagnosis not present

## 2022-02-19 LAB — ECHOCARDIOGRAM COMPLETE
Area-P 1/2: 2.99 cm2
S' Lateral: 3.4 cm

## 2022-02-25 ENCOUNTER — Telehealth: Payer: Self-pay | Admitting: *Deleted

## 2022-02-25 DIAGNOSIS — I77819 Aortic ectasia, unspecified site: Secondary | ICD-10-CM

## 2022-02-25 NOTE — Telephone Encounter (Signed)
Spoke with patient and provided echo results.   He is aware of plan for MRA chest in May 2024.

## 2022-02-25 NOTE — Telephone Encounter (Signed)
-----   Message from Early Osmond, MD sent at 02/19/2022  3:44 PM EST ----- Lets get an MRA re: aortic dilatation in 6 months

## 2022-03-20 ENCOUNTER — Ambulatory Visit (HOSPITAL_COMMUNITY)
Admission: RE | Admit: 2022-03-20 | Discharge: 2022-03-20 | Disposition: A | Discharge status: Home / Self Care | Payer: Medicare Other | Source: Ambulatory Visit | Attending: Internal Medicine | Admitting: Internal Medicine

## 2022-03-20 DIAGNOSIS — I77819 Aortic ectasia, unspecified site: Secondary | ICD-10-CM

## 2022-03-20 DIAGNOSIS — I712 Thoracic aortic aneurysm, without rupture, unspecified: Secondary | ICD-10-CM | POA: Diagnosis not present

## 2022-03-20 MED ORDER — GADOBUTROL 1 MMOL/ML IV SOLN
10.0000 mL | Freq: Once | INTRAVENOUS | Status: AC | PRN
  Administered 2022-03-20: 10 mL via INTRAVENOUS

## 2022-04-10 DIAGNOSIS — R634 Abnormal weight loss: Secondary | ICD-10-CM | POA: Diagnosis not present

## 2022-04-10 DIAGNOSIS — Z6835 Body mass index (BMI) 35.0-35.9, adult: Secondary | ICD-10-CM | POA: Diagnosis not present

## 2022-04-10 DIAGNOSIS — R21 Rash and other nonspecific skin eruption: Secondary | ICD-10-CM | POA: Diagnosis not present

## 2022-04-10 DIAGNOSIS — M2550 Pain in unspecified joint: Secondary | ICD-10-CM | POA: Diagnosis not present

## 2022-04-23 DIAGNOSIS — Z6834 Body mass index (BMI) 34.0-34.9, adult: Secondary | ICD-10-CM | POA: Diagnosis not present

## 2022-04-23 DIAGNOSIS — M2550 Pain in unspecified joint: Secondary | ICD-10-CM | POA: Diagnosis not present

## 2022-04-23 DIAGNOSIS — R21 Rash and other nonspecific skin eruption: Secondary | ICD-10-CM | POA: Diagnosis not present

## 2022-04-23 DIAGNOSIS — M791 Myalgia, unspecified site: Secondary | ICD-10-CM | POA: Diagnosis not present

## 2022-04-23 DIAGNOSIS — E669 Obesity, unspecified: Secondary | ICD-10-CM | POA: Diagnosis not present

## 2022-04-23 DIAGNOSIS — D8989 Other specified disorders involving the immune mechanism, not elsewhere classified: Secondary | ICD-10-CM | POA: Diagnosis not present

## 2022-04-23 DIAGNOSIS — R768 Other specified abnormal immunological findings in serum: Secondary | ICD-10-CM | POA: Diagnosis not present

## 2022-04-23 DIAGNOSIS — R8290 Unspecified abnormal findings in urine: Secondary | ICD-10-CM | POA: Diagnosis not present

## 2022-04-23 DIAGNOSIS — R700 Elevated erythrocyte sedimentation rate: Secondary | ICD-10-CM | POA: Diagnosis not present

## 2022-04-23 DIAGNOSIS — M2540 Effusion, unspecified joint: Secondary | ICD-10-CM | POA: Diagnosis not present

## 2022-04-23 DIAGNOSIS — M2560 Stiffness of unspecified joint, not elsewhere classified: Secondary | ICD-10-CM | POA: Diagnosis not present

## 2022-04-23 DIAGNOSIS — R5383 Other fatigue: Secondary | ICD-10-CM | POA: Diagnosis not present

## 2022-05-12 DIAGNOSIS — D649 Anemia, unspecified: Secondary | ICD-10-CM | POA: Diagnosis not present

## 2022-05-12 DIAGNOSIS — R634 Abnormal weight loss: Secondary | ICD-10-CM | POA: Diagnosis not present

## 2022-05-12 DIAGNOSIS — Z86010 Personal history of colonic polyps: Secondary | ICD-10-CM | POA: Diagnosis not present

## 2022-05-14 DIAGNOSIS — R5383 Other fatigue: Secondary | ICD-10-CM | POA: Diagnosis not present

## 2022-05-14 DIAGNOSIS — R768 Other specified abnormal immunological findings in serum: Secondary | ICD-10-CM | POA: Diagnosis not present

## 2022-05-14 DIAGNOSIS — M2550 Pain in unspecified joint: Secondary | ICD-10-CM | POA: Diagnosis not present

## 2022-05-14 DIAGNOSIS — R700 Elevated erythrocyte sedimentation rate: Secondary | ICD-10-CM | POA: Diagnosis not present

## 2022-05-14 DIAGNOSIS — E669 Obesity, unspecified: Secondary | ICD-10-CM | POA: Diagnosis not present

## 2022-05-14 DIAGNOSIS — Z6834 Body mass index (BMI) 34.0-34.9, adult: Secondary | ICD-10-CM | POA: Diagnosis not present

## 2022-05-28 DIAGNOSIS — R351 Nocturia: Secondary | ICD-10-CM | POA: Diagnosis not present

## 2022-05-28 DIAGNOSIS — N401 Enlarged prostate with lower urinary tract symptoms: Secondary | ICD-10-CM | POA: Diagnosis not present

## 2022-06-04 DIAGNOSIS — Z09 Encounter for follow-up examination after completed treatment for conditions other than malignant neoplasm: Secondary | ICD-10-CM | POA: Diagnosis not present

## 2022-06-04 DIAGNOSIS — D509 Iron deficiency anemia, unspecified: Secondary | ICD-10-CM | POA: Diagnosis not present

## 2022-06-04 DIAGNOSIS — K2930 Chronic superficial gastritis without bleeding: Secondary | ICD-10-CM | POA: Diagnosis not present

## 2022-06-04 DIAGNOSIS — D123 Benign neoplasm of transverse colon: Secondary | ICD-10-CM | POA: Diagnosis not present

## 2022-06-04 DIAGNOSIS — K229 Disease of esophagus, unspecified: Secondary | ICD-10-CM | POA: Diagnosis not present

## 2022-06-04 DIAGNOSIS — R634 Abnormal weight loss: Secondary | ICD-10-CM | POA: Diagnosis not present

## 2022-06-04 DIAGNOSIS — Z86010 Personal history of colonic polyps: Secondary | ICD-10-CM | POA: Diagnosis not present

## 2022-06-04 DIAGNOSIS — K5730 Diverticulosis of large intestine without perforation or abscess without bleeding: Secondary | ICD-10-CM | POA: Diagnosis not present

## 2022-06-04 DIAGNOSIS — K2970 Gastritis, unspecified, without bleeding: Secondary | ICD-10-CM | POA: Diagnosis not present

## 2022-06-04 DIAGNOSIS — K640 First degree hemorrhoids: Secondary | ICD-10-CM | POA: Diagnosis not present

## 2022-06-04 DIAGNOSIS — D125 Benign neoplasm of sigmoid colon: Secondary | ICD-10-CM | POA: Diagnosis not present

## 2022-06-04 DIAGNOSIS — D124 Benign neoplasm of descending colon: Secondary | ICD-10-CM | POA: Diagnosis not present

## 2022-06-06 DIAGNOSIS — D124 Benign neoplasm of descending colon: Secondary | ICD-10-CM | POA: Diagnosis not present

## 2022-06-06 DIAGNOSIS — K2930 Chronic superficial gastritis without bleeding: Secondary | ICD-10-CM | POA: Diagnosis not present

## 2022-06-06 DIAGNOSIS — D123 Benign neoplasm of transverse colon: Secondary | ICD-10-CM | POA: Diagnosis not present

## 2022-06-26 DIAGNOSIS — N201 Calculus of ureter: Secondary | ICD-10-CM | POA: Diagnosis not present

## 2022-06-26 DIAGNOSIS — R3912 Poor urinary stream: Secondary | ICD-10-CM | POA: Diagnosis not present

## 2022-06-26 DIAGNOSIS — N401 Enlarged prostate with lower urinary tract symptoms: Secondary | ICD-10-CM | POA: Diagnosis not present

## 2022-06-26 DIAGNOSIS — R351 Nocturia: Secondary | ICD-10-CM | POA: Diagnosis not present

## 2022-07-29 DIAGNOSIS — N200 Calculus of kidney: Secondary | ICD-10-CM | POA: Diagnosis not present

## 2022-07-29 DIAGNOSIS — K5730 Diverticulosis of large intestine without perforation or abscess without bleeding: Secondary | ICD-10-CM | POA: Diagnosis not present

## 2022-07-29 DIAGNOSIS — N132 Hydronephrosis with renal and ureteral calculous obstruction: Secondary | ICD-10-CM | POA: Diagnosis not present

## 2022-08-06 ENCOUNTER — Other Ambulatory Visit: Payer: Self-pay | Admitting: Urology

## 2022-08-13 ENCOUNTER — Encounter (HOSPITAL_BASED_OUTPATIENT_CLINIC_OR_DEPARTMENT_OTHER): Payer: Self-pay | Admitting: Urology

## 2022-08-13 NOTE — Progress Notes (Signed)
Spoke with Mitchell Pennington regarding arrival time 0600, 08/18/2022, [facility] for lithotripsy. Nothing to eat or drink after midnight 08/17/2022. Laxative Sunday. May take metoprolol, protonis, and glaucoma drops morning of procedure. Wear loose fitting clothing and no sandals or flip flops.

## 2022-08-18 ENCOUNTER — Encounter (HOSPITAL_BASED_OUTPATIENT_CLINIC_OR_DEPARTMENT_OTHER)
Admission: RE | Disposition: A | Discharge status: Home / Self Care | Payer: Self-pay | Source: Home / Self Care | Attending: Urology

## 2022-08-18 ENCOUNTER — Other Ambulatory Visit: Payer: Self-pay

## 2022-08-18 ENCOUNTER — Encounter (HOSPITAL_BASED_OUTPATIENT_CLINIC_OR_DEPARTMENT_OTHER): Payer: Self-pay | Admitting: Urology

## 2022-08-18 ENCOUNTER — Ambulatory Visit (HOSPITAL_BASED_OUTPATIENT_CLINIC_OR_DEPARTMENT_OTHER)
Admission: RE | Admit: 2022-08-18 | Discharge: 2022-08-18 | Disposition: A | Discharge status: Home / Self Care | Payer: Medicare Other | Attending: Urology | Admitting: Urology

## 2022-08-18 ENCOUNTER — Ambulatory Visit (HOSPITAL_COMMUNITY): Payer: Medicare Other

## 2022-08-18 DIAGNOSIS — N202 Calculus of kidney with calculus of ureter: Secondary | ICD-10-CM | POA: Diagnosis not present

## 2022-08-18 DIAGNOSIS — N200 Calculus of kidney: Secondary | ICD-10-CM | POA: Diagnosis not present

## 2022-08-18 DIAGNOSIS — N201 Calculus of ureter: Secondary | ICD-10-CM | POA: Diagnosis not present

## 2022-08-18 HISTORY — PX: EXTRACORPOREAL SHOCK WAVE LITHOTRIPSY: SHX1557

## 2022-08-18 SURGERY — LITHOTRIPSY, ESWL
Anesthesia: LOCAL | Laterality: Left

## 2022-08-18 MED ORDER — DIPHENHYDRAMINE HCL 25 MG PO CAPS
ORAL_CAPSULE | ORAL | Status: AC
  Filled 2022-08-18: qty 1

## 2022-08-18 MED ORDER — DOCUSATE SODIUM 100 MG PO CAPS: 100.0000 mg | ORAL_CAPSULE | Freq: Every day | ORAL | 0 refills | Status: AC | PRN

## 2022-08-18 MED ORDER — DIAZEPAM 5 MG PO TABS
ORAL_TABLET | ORAL | Status: AC
  Filled 2022-08-18: qty 2

## 2022-08-18 MED ORDER — OXYCODONE-ACETAMINOPHEN 5-325 MG PO TABS: 1.0000 | ORAL_TABLET | ORAL | 0 refills | Status: DC | PRN

## 2022-08-18 MED ORDER — DIPHENHYDRAMINE HCL 25 MG PO CAPS
25.0000 mg | ORAL_CAPSULE | ORAL | Status: AC
  Administered 2022-08-18: 25 mg via ORAL

## 2022-08-18 MED ORDER — CIPROFLOXACIN HCL 500 MG PO TABS
ORAL_TABLET | ORAL | Status: AC
  Filled 2022-08-18: qty 1

## 2022-08-18 MED ORDER — CIPROFLOXACIN HCL 500 MG PO TABS
500.0000 mg | ORAL_TABLET | ORAL | Status: AC
  Administered 2022-08-18: 500 mg via ORAL

## 2022-08-18 MED ORDER — DIAZEPAM 5 MG PO TABS
10.0000 mg | ORAL_TABLET | ORAL | Status: AC
  Administered 2022-08-18: 10 mg via ORAL

## 2022-08-18 MED ORDER — SODIUM CHLORIDE 0.9 % IV SOLN: INTRAVENOUS | Status: DC

## 2022-08-18 NOTE — H&P (Signed)
  Urology Preoperative H&P   Chief Complaint: Left ureteral stone  History of Present Illness: Mitchell Pennington is a 72 y.o. male with a left ureteral stone here for left ESWL.    Past Medical History:  Diagnosis Date   Allergic rhinitis    Angio-edema    BMI 36.0-36.9,adult    GERD (gastroesophageal reflux disease)    History of colon polyps    History of kidney stones    Hypertension    Murmur    Obesity    Rash    Unspecified disturbances of smell and taste    Urticaria     Past Surgical History:  Procedure Laterality Date   EXTRACORPOREAL SHOCK WAVE LITHOTRIPSY Right 05/27/2021   Procedure: EXTRACORPOREAL SHOCK WAVE LITHOTRIPSY (ESWL);  Surgeon: Sebastian Ache, MD;  Location[facility];  Service: Urology;  Laterality: Right;    Allergies:  Allergies  Allergen Reactions   Angiotensin Receptor Blockers     ACE-induced angioedema   Contrast Media [Iodinated Contrast Media] Shortness Of Breath   Iodine Shortness Of Breath   Lisinopril Anaphylaxis    Angioedema    History reviewed. No pertinent family history.  Social History:  reports that he has never smoked. He has never used smokeless tobacco. He reports that he does not drink alcohol and does not use drugs.  ROS: A complete review of systems was performed.  All systems are negative except for pertinent findings as noted.  Physical Exam:  Vital signs in last 24 hours: Temp:  [98.1 F (36.7 C)] 98.1 F (36.7 C) (05/13 0643) Pulse Rate:  [49] 49 (05/13 0643) Resp:  [17] 17 (05/13 0643) BP: (165)/(93) 165/93 (05/13 0643) SpO2:  [98 %] 98 % (05/13 0643) Weight:  [109.9 kg] 109.9 kg (05/13 0643) Constitutional:  Alert and oriented, No acute distress Cardiovascular: Regular rate and rhythm Respiratory: Normal respiratory effort, Lungs clear bilaterally GI: Abdomen is soft, nontender, nondistended, no abdominal masses GU: No CVA tenderness Lymphatic: No lymphadenopathy Neurologic:  Grossly intact, no focal deficits Psychiatric: Normal mood and affect  Laboratory Data:  No results for input(s): "WBC", "HGB", "HCT", "PLT" in the last 72 hours.  No results for input(s): "NA", "K", "CL", "GLUCOSE", "BUN", "CALCIUM", "CREATININE" in the last 72 hours.  Invalid input(s): "CO3"   No results found for this or any previous visit (from the past 24 hour(s)). No results found for this or any previous visit (from the past 240 hour(s)).  Renal Function: No results for input(s): "CREATININE" in the last 168 hours. CrCl cannot be calculated (Patient's most recent lab result is older than the maximum 21 days allowed.).  Radiologic Imaging: No results found.  I independently reviewed the above imaging studies.  Assessment and Plan Mitchell Pennington is a 72 y.o. male with a left ureteral stone here for left ESWL.  The risks, benefits and alternatives of left ESWL was discussed with the patient. I described the risks which include arrhythmia, kidney contusion, kidney hemorrhage, need for transfusion, back discomfort, flank ecchymosis, flank abrasion, inability to fracture the stone, inability to pass stone fragments, Steinstrasse, infection associated with obstructing stones, need for an alternative surgical procedure and possible need for repeat shockwave lithotripsy.  The patient voices understanding and wishes to proceed.        Matt R. Cace Osorto MD 08/18/2022, 8:29 AM  [facility] Pager: [phone]): [phone]

## 2022-08-18 NOTE — Op Note (Signed)
ESWL Operative Note  Treating Physician: Earvin Blazier, MD  Pre-op diagnosis: Left ureteral stone  Post-op diagnosis: Same   Procedure: Left  ESWL  See [facility] OP note scanned into chart. Also because of the size, density, location and other factors that cannot be anticipated I feel this will likely be a staged procedure. This fact supersedes any indication in the scanned [facility] operative note to the contrary.  Matt R. Tahari Clabaugh MD [facility]  Pager: [phone]   

## 2022-08-18 NOTE — Discharge Instructions (Signed)

## 2022-08-20 ENCOUNTER — Encounter (HOSPITAL_BASED_OUTPATIENT_CLINIC_OR_DEPARTMENT_OTHER): Payer: Self-pay | Admitting: Urology

## 2022-09-02 DIAGNOSIS — N202 Calculus of kidney with calculus of ureter: Secondary | ICD-10-CM | POA: Diagnosis not present

## 2022-09-10 DIAGNOSIS — R21 Rash and other nonspecific skin eruption: Secondary | ICD-10-CM | POA: Diagnosis not present

## 2022-09-10 DIAGNOSIS — E669 Obesity, unspecified: Secondary | ICD-10-CM | POA: Diagnosis not present

## 2022-09-10 DIAGNOSIS — R5383 Other fatigue: Secondary | ICD-10-CM | POA: Diagnosis not present

## 2022-09-10 DIAGNOSIS — R700 Elevated erythrocyte sedimentation rate: Secondary | ICD-10-CM | POA: Diagnosis not present

## 2022-09-10 DIAGNOSIS — R768 Other specified abnormal immunological findings in serum: Secondary | ICD-10-CM | POA: Diagnosis not present

## 2022-09-10 DIAGNOSIS — Z6833 Body mass index (BMI) 33.0-33.9, adult: Secondary | ICD-10-CM | POA: Diagnosis not present

## 2022-09-22 DIAGNOSIS — H401112 Primary open-angle glaucoma, right eye, moderate stage: Secondary | ICD-10-CM | POA: Diagnosis not present

## 2022-09-22 DIAGNOSIS — H25811 Combined forms of age-related cataract, right eye: Secondary | ICD-10-CM | POA: Diagnosis not present

## 2022-09-22 DIAGNOSIS — H25812 Combined forms of age-related cataract, left eye: Secondary | ICD-10-CM | POA: Diagnosis not present

## 2022-09-22 DIAGNOSIS — H401123 Primary open-angle glaucoma, left eye, severe stage: Secondary | ICD-10-CM | POA: Diagnosis not present

## 2022-11-18 DIAGNOSIS — Z125 Encounter for screening for malignant neoplasm of prostate: Secondary | ICD-10-CM | POA: Diagnosis not present

## 2022-11-18 DIAGNOSIS — I10 Essential (primary) hypertension: Secondary | ICD-10-CM | POA: Diagnosis not present

## 2022-11-18 DIAGNOSIS — R7301 Impaired fasting glucose: Secondary | ICD-10-CM | POA: Diagnosis not present

## 2022-11-25 ENCOUNTER — Other Ambulatory Visit: Payer: Self-pay | Admitting: Family Medicine

## 2022-11-25 DIAGNOSIS — Z0000 Encounter for general adult medical examination without abnormal findings: Secondary | ICD-10-CM | POA: Diagnosis not present

## 2022-11-25 DIAGNOSIS — I10 Essential (primary) hypertension: Secondary | ICD-10-CM | POA: Diagnosis not present

## 2022-11-25 DIAGNOSIS — R634 Abnormal weight loss: Secondary | ICD-10-CM

## 2022-11-25 DIAGNOSIS — Z6834 Body mass index (BMI) 34.0-34.9, adult: Secondary | ICD-10-CM | POA: Diagnosis not present

## 2022-11-25 DIAGNOSIS — K219 Gastro-esophageal reflux disease without esophagitis: Secondary | ICD-10-CM | POA: Diagnosis not present

## 2022-11-25 DIAGNOSIS — J309 Allergic rhinitis, unspecified: Secondary | ICD-10-CM | POA: Diagnosis not present

## 2022-11-25 DIAGNOSIS — R198 Other specified symptoms and signs involving the digestive system and abdomen: Secondary | ICD-10-CM | POA: Diagnosis not present

## 2022-11-26 ENCOUNTER — Other Ambulatory Visit (HOSPITAL_COMMUNITY): Payer: Self-pay | Admitting: Family Medicine

## 2022-11-26 DIAGNOSIS — R634 Abnormal weight loss: Secondary | ICD-10-CM

## 2022-12-02 ENCOUNTER — Ambulatory Visit (HOSPITAL_COMMUNITY)
Admission: RE | Admit: 2022-12-02 | Discharge: 2022-12-02 | Disposition: A | Discharge status: Home / Self Care | Payer: Medicare Other | Source: Ambulatory Visit | Attending: Family Medicine | Admitting: Family Medicine

## 2022-12-02 DIAGNOSIS — K5730 Diverticulosis of large intestine without perforation or abscess without bleeding: Secondary | ICD-10-CM | POA: Diagnosis not present

## 2022-12-02 DIAGNOSIS — R634 Abnormal weight loss: Secondary | ICD-10-CM | POA: Diagnosis not present

## 2022-12-02 DIAGNOSIS — R1031 Right lower quadrant pain: Secondary | ICD-10-CM | POA: Diagnosis not present

## 2022-12-02 DIAGNOSIS — N281 Cyst of kidney, acquired: Secondary | ICD-10-CM | POA: Diagnosis not present

## 2022-12-02 NOTE — Progress Notes (Unsigned)
Cardiology Office Note:   Date:  12/03/2022  ID:  Mitchell Pennington, DOB November 14, 1950, MRN [id number] PCP:  Daisy Floro, MD  [facility] Providers Cardiologist:  Alverda Skeans, MD Referring MD: Daisy Floro, MD   Chief Complaint/Reason for Referral: Cardiology follow-up ASSESSMENT:    1. Left ventricular hypertrophy   2. Aortic dilatation (HCC)   3. Primary hypertension   4. Body mass index (BMI) of 36.0 to 36.9   5. Routine general medical examination at health care facility     PLAN:   In order of problems listed above: 1.  Asymmetric left ventricular hypertrophy: Will refer for cardiac MRI to evaluate for hypertrophic obstructive cardiomyopathy.  If suggestive will refer to Dr. Raynelle Jan for further recommendations.  Patient intolerant of ACE inhibitors or ARB's. 2.  Aortic dilatation: Will obtain chest CTA to evaluate further. 3.  Hypertension: Blood pressure is well-controlled on his current regimen. 4.  Elevated BMI: Continue diet and exercise modification 5.  General Medical care: Will check lipid panel, CMP, and LP(a) today.             Dispo:  Return in about 6 months (around 06/05/2023).      Medication Adjustments/Labs and Tests Ordered: Current medicines are reviewed at length with the patient today.  Concerns regarding medicines are outlined above.  The following changes have been made:  no change   Labs/tests ordered: Orders Placed This Encounter  Procedures   EKG 12-Lead    Medication Changes: No orders of the defined types were placed in this encounter.   Current medicines are reviewed at length with the patient today.  The patient does not have concerns regarding medicines.  History of Present Illness:   FOCUSED PROBLEM LIST:   ACE inhibitor and ARB induced angioedema Hypertension GERD BMI of 36 Ascending aortic dilatation of 41 mm on TTE 2023  Oct 2023:  The patient is a 72 y.o. male with the indicated medical history  here for for an incidentally noted murmur.  The patient was seen by his primary care provider recently.  A murmur was noted.  For this reason he was referred for cardiology consultation.  The patient has been doing relatively well.  He is able to do all of his activities of daily living without any issues.  At night he sometimes gets a chest pain that is exacerbated by chest wall motion.  He does not get this chest pain when he exerts himself.  He denies any significant shortness of breath, presyncope, syncope, palpitations, paroxysmal nocturnal dyspnea, orthopnea.  He is otherwise well without significant complaints.  Plan: Obtain echocardiogram.  August 2024: In the interim the patient had an echocardiogram which demonstrated mild asymmetric left ventricular hypertrophy of the basal septal segment.  His ejection fraction was normal.  He also had dilatation of the ascending aorta.  He underwent uncomplicated lithotripsy of a left ureteral stone in May of this year.  The patient is doing well.  He denies any significant shortness of breath or exertional angina.  He has noticed some mild right lower extremity edema which is chronic.  His biggest issue today is right flank pain and he had a CT scan done yesterday which has not yet been read for this issue.  He denies any presyncope or syncope, he has had no palpitations.  He has required no emergency room visits or unplanned hospitalizations for any issues.  He otherwise feels well.       Previous Medical History: Past  Medical History:  Diagnosis Date   Allergic rhinitis    Angio-edema    BMI 36.0-36.9,adult    GERD (gastroesophageal reflux disease)    History of colon polyps    History of kidney stones    Hypertension    Murmur    Obesity    Rash    Unspecified disturbances of smell and taste    Urticaria      Current Medications: Current Meds  Medication Sig   amLODipine (NORVASC) 2.5 MG tablet Take 2.5 mg by mouth daily.   Ascorbic Acid  (VITAMIN C) 1000 MG tablet Take 1,000 mg by mouth daily.   aspirin-acetaminophen-caffeine (EXCEDRIN MIGRAINE) 250-250-65 MG tablet Take 1 tablet by mouth every 6 (six) hours as needed for headache.   atenolol (TENORMIN) 50 MG tablet Take 50 mg by mouth daily.   diphenhydramine-acetaminophen (TYLENOL PM) 25-500 MG TABS tablet Take 1 tablet by mouth every 6 (six) hours as needed (hives/itching).   docusate sodium (COLACE) 100 MG capsule Take 1 capsule (100 mg total) by mouth daily as needed for up to 30 doses.   DORZOLAMIDE HCL-TIMOLOL MAL OP Place 1 drop into both eyes daily.   fexofenadine (ALLEGRA) 180 MG tablet Take 180 mg by mouth as needed for allergies.   hydrocortisone cream 1 % Apply 1 application topically See admin instructions. Apply topically after shaving - approximately 3 times week   latanoprost (XALATAN) 0.005 % ophthalmic solution Place 1 drop into both eyes at bedtime.   loratadine (CLARITIN) 10 MG tablet Take 10 mg by mouth daily as needed for allergies.   montelukast (SINGULAIR) 10 MG tablet Take 10 mg by mouth daily as needed (seasonal allergies).   neomycin-bacitracin-polymyxin (NEOSPORIN) OINT Apply 1 application topically See admin instructions. Apply topically after shaving - approximately three times week   pantoprazole (PROTONIX) 40 MG tablet Take 40 mg by mouth daily.   tadalafil (CIALIS) 5 MG tablet Take 5 mg by mouth at bedtime.   triamcinolone cream (KENALOG) 0.1 % Apply 1 application topically 2 (two) times daily as needed (rash/itching).    Vitamin E 100 units TABS Take 1 tablet by mouth daily. Patient not taking     Allergies:    Angiotensin receptor blockers, Contrast media [iodinated contrast media], Iodine, and Lisinopril   Social History:   Social History   Tobacco Use   Smoking status: Never   Smokeless tobacco: Never  Vaping Use   Vaping status: Never Used  Substance Use Topics   Alcohol use: No   Drug use: No     Family Hx: History reviewed.  No pertinent family history.   Review of Systems:   Please see the history of present illness.    All other systems reviewed and are negative.     EKGs/Labs/Other Test Reviewed:   EKG: EKG performed October 2023 that I personally reviewed demonstrates sinus rhythm with left ventricular hypertrophy  EKG Interpretation Date/Time:  Wednesday December 03 2022 14:10:58 EDT Ventricular Rate:  63 PR Interval:  180 QRS Duration:  104 QT Interval:  410 QTC Calculation: 419 R Axis:   -9  Text Interpretation: Normal sinus rhythm Minimal voltage criteria for LVH, may be normal variant ( R in aVL ) Septal infarct , age undetermined T wave abnormality, consider anterolateral ischemia When compared with ECG of 12-Dec-2013 04:07, PREVIOUS ECG IS PRESENT Confirmed by Alverda Skeans ([id number]) on 12/03/2022 2:16:39 PM        Prior CV studies reviewed: Cardiac Studies & Procedures  ECHOCARDIOGRAM  ECHOCARDIOGRAM COMPLETE 02/19/2022  Narrative ECHOCARDIOGRAM REPORT    Patient Name:   DALEN LARGENT Date of Exam: 02/19/2022 Medical Rec #:  [id number]           Height:       71.0 in Accession #:    [id number]          Weight:       251.4 lb Date of Birth:  10-Mar-1951           BSA:          2.324 m Patient Age:    71 years            BP:           126/72 mmHg Patient Gender: M                   HR:           69 bpm. Exam Location:  [address]  Procedure: 2D Echo, Cardiac Doppler and Color Doppler  Indications:    R01.1 Murmur  History:        Patient has no prior history of Echocardiogram examinations. Signs/Symptoms:Murmur; Risk Factors:Hypertension.  Sonographer:    Daphine Deutscher RDCS Referring Phys: [id number] Orbie Pyo  IMPRESSIONS   1. Left ventricular ejection fraction, by estimation, is 60 to 65%. The left ventricle has normal function. The left ventricle has no regional wall motion abnormalities. There is mild asymmetric left ventricular hypertrophy of the  basal-septal segment. Left ventricular diastolic parameters are consistent with Grade I diastolic dysfunction (impaired relaxation). 2. Right ventricular systolic function is normal. The right ventricular size is mildly enlarged. There is normal pulmonary artery systolic pressure. 3. The mitral valve is normal in structure. Trivial mitral valve regurgitation. No evidence of mitral stenosis. 4. The aortic valve is tricuspid. Aortic valve regurgitation is not visualized. No aortic stenosis is present. 5. Aortic dilatation noted. There is dilatation of the ascending aorta, measuring 41 mm.  FINDINGS Left Ventricle: Left ventricular ejection fraction, by estimation, is 60 to 65%. The left ventricle has normal function. The left ventricle has no regional wall motion abnormalities. The left ventricular internal cavity size was normal in size. There is mild asymmetric left ventricular hypertrophy of the basal-septal segment. Left ventricular diastolic parameters are consistent with Grade I diastolic dysfunction (impaired relaxation).  Right Ventricle: The right ventricular size is mildly enlarged. No increase in right ventricular wall thickness. Right ventricular systolic function is normal. There is normal pulmonary artery systolic pressure. The tricuspid regurgitant velocity is 2.07 m/s, and with an assumed right atrial pressure of 3 mmHg, the estimated right ventricular systolic pressure is 20.1 mmHg.  Left Atrium: Left atrial size was normal in size.  Right Atrium: Right atrial size was normal in size.  Pericardium: There is no evidence of pericardial effusion.  Mitral Valve: The mitral valve is normal in structure. Trivial mitral valve regurgitation. No evidence of mitral valve stenosis.  Tricuspid Valve: The tricuspid valve is normal in structure. Tricuspid valve regurgitation is trivial.  Aortic Valve: The aortic valve is tricuspid. Aortic valve regurgitation is not visualized. No aortic  stenosis is present.  Pulmonic Valve: The pulmonic valve was grossly normal. Pulmonic valve regurgitation is not visualized.  Aorta: The aortic root is normal in size and structure and aortic dilatation noted. There is dilatation of the ascending aorta, measuring 41 mm.  IAS/Shunts: The interatrial septum was not well visualized.   LEFT VENTRICLE PLAX 2D LVIDd:  5.20 cm   Diastology LVIDs:         3.40 cm   LV e' medial:    5.28 cm/s LV PW:         0.80 cm   LV E/e' medial:  12.9 LV IVS:        0.80 cm   LV e' lateral:   3.59 cm/s LVOT diam:     1.90 cm   LV E/e' lateral: 18.9 LV SV:         66 LV SV Index:   28 LVOT Area:     2.84 cm   RIGHT VENTRICLE             IVC RV Basal diam:  4.20 cm     IVC diam: 1.60 cm RV S prime:     15.25 cm/s TAPSE (M-mode): 2.4 cm  LEFT ATRIUM           Index        RIGHT ATRIUM           Index LA diam:      4.50 cm 1.94 cm/m   RA Area:     12.70 cm LA Vol (A2C): 55.4 ml 23.84 ml/m  RA Volume:   30.00 ml  12.91 ml/m LA Vol (A4C): 33.3 ml 14.33 ml/m AORTIC VALVE LVOT Vmax:   107.50 cm/s LVOT Vmean:  73.350 cm/s LVOT VTI:    0.234 m  AORTA Ao Root diam: 3.70 cm Ao Asc diam:  4.10 cm  MITRAL VALVE                TRICUSPID VALVE MV Area (PHT): 2.99 cm     TR Peak grad:   17.1 mmHg MV Decel Time: 254 msec     TR Vmax:        207.00 cm/s MV E velocity: 67.85 cm/s MV A velocity: 100.25 cm/s  SHUNTS MV E/A ratio:  0.68         Systemic VTI:  0.23 m Systemic Diam: 1.90 cm  Epifanio Lesches MD Electronically signed by Epifanio Lesches MD Signature Date/Time: 02/19/2022/3:28:27 PM    Final             Recent Labs: No results found for requested labs within last 365 days.   Lipid Panel No results found for: "CHOL", "TRIG", "HDL", "CHOLHDL", "VLDL", "LDLCALC", "LDLDIRECT"  Risk Assessment/Calculations:          Physical Exam:   VS:  BP 124/80 (BP Location: Left Arm, Patient Position: Sitting, Cuff Size:  Normal)   Pulse 63   Ht 5\' 11"  (1.803 m)   Wt 247 lb (112 kg)   BMI 34.45 kg/m        Wt Readings from Last 3 Encounters:  12/03/22 247 lb (112 kg)  08/18/22 242 lb 3.2 oz (109.9 kg)  01/30/22 251 lb 6.4 oz (114 kg)      GENERAL:  No apparent distress, AOx3 HEENT:  No carotid bruits, +2 carotid impulses, no scleral icterus CAR: RRR no murmurs, gallops, rubs, or thrills RES:  Clear to auscultation bilaterally ABD:  Soft, nontender, nondistended, positive bowel sounds x 4 VASC:  +2 radial pulses, +2 carotid pulses NEURO:  CN 2-12 grossly intact; motor and sensory grossly intact PSYCH:  No active depression or anxiety EXT:  No edema, ecchymosis, or cyanosis  Signed, Orbie Pyo, MD  12/03/2022 2:26 PM    [facility] [address] [place], [place], [place]  [address] Phone: [phone]; Fax: [phone]   Note:  This document was prepared using Dragon voice recognition software and may include unintentional dictation errors.

## 2022-12-03 ENCOUNTER — Ambulatory Visit: Payer: Medicare Other | Attending: Internal Medicine | Admitting: Internal Medicine

## 2022-12-03 ENCOUNTER — Other Ambulatory Visit: Payer: Self-pay

## 2022-12-03 ENCOUNTER — Telehealth: Payer: Self-pay | Admitting: Internal Medicine

## 2022-12-03 ENCOUNTER — Encounter: Payer: Self-pay | Admitting: Internal Medicine

## 2022-12-03 ENCOUNTER — Other Ambulatory Visit (HOSPITAL_COMMUNITY): Payer: Self-pay | Admitting: Emergency Medicine

## 2022-12-03 VITALS — BP 124/80 | HR 63 | Ht 71.0 in | Wt 247.0 lb

## 2022-12-03 DIAGNOSIS — Z0000 Encounter for general adult medical examination without abnormal findings: Secondary | ICD-10-CM | POA: Diagnosis not present

## 2022-12-03 DIAGNOSIS — I77819 Aortic ectasia, unspecified site: Secondary | ICD-10-CM | POA: Diagnosis not present

## 2022-12-03 DIAGNOSIS — Z6836 Body mass index (BMI) 36.0-36.9, adult: Secondary | ICD-10-CM

## 2022-12-03 DIAGNOSIS — I10 Essential (primary) hypertension: Secondary | ICD-10-CM

## 2022-12-03 DIAGNOSIS — I517 Cardiomegaly: Secondary | ICD-10-CM | POA: Diagnosis not present

## 2022-12-03 DIAGNOSIS — R079 Chest pain, unspecified: Secondary | ICD-10-CM

## 2022-12-03 MED ORDER — PREDNISONE 50 MG PO TABS: 50.0000 mg | ORAL_TABLET | ORAL | 0 refills | Status: AC

## 2022-12-03 MED ORDER — PREDNISONE 50 MG PO TABS
ORAL_TABLET | ORAL | 0 refills | Status: DC
Start: 2022-12-03 — End: 2022-12-03

## 2022-12-03 MED ORDER — DIPHENHYDRAMINE HCL 50 MG PO CAPS: ORAL_CAPSULE | ORAL | 0 refills | Status: AC

## 2022-12-03 NOTE — Patient Instructions (Addendum)
Medication Instructions:  Your physician recommends that you continue on your current medications as directed. Please refer to the Current Medication list given to you today.  *If you need a refill on your cardiac medications before your next appointment, please call your pharmacy*   Lab Work: CBC, CMET, Lipids, LPa If you have labs (blood work) drawn today and your tests are completely normal, you will receive your results only by: MyChart Message (if you have MyChart) OR A paper copy in the mail If you have any lab test that is abnormal or we need to change your treatment, we will call you to review the results.   Testing/Procedures: Non-Cardiac CT scanning, (CAT scanning), is a noninvasive, special x-ray that produces cross-sectional images of the body using x-rays and a computer. CT scans help physicians diagnose and treat medical conditions. For some CT exams, a contrast material is used to enhance visibility in the area of the body being studied. CT scans provide greater clarity and reveal more details than regular x-ray exams.    Follow-Up: At [facility], you and your health needs are our priority.  As part of our continuing mission to provide you with exceptional heart care, we have created designated Provider Care Teams.  These Care Teams include your primary Cardiologist (physician) and Advanced Practice Providers (APPs -  Physician Assistants and Nurse Practitioners) who all work together to provide you with the care you need, when you need it.    Your next appointment:   6 month(s)  Provider:   Orbie Pyo, MD     Other Instructions   You are scheduled for Cardiac MRI on ______________. Please arrive for your appointment at ______________ ( arrive 30-45 minutes prior to test start time). ?  [facility] [address] [place], [place] [address] [phone] Please take advantage of the free valet parking available at the [facility] and  [employer] (Entrance C).  Proceed to the [facility] Radiology Department (First Floor) for check-in.    Magnetic resonance imaging (MRI) is a painless test that produces images of the inside of the body without using Xrays.  During an MRI, strong magnets and radio waves work together in a Data processing manager to form detailed images.   MRI images may provide more details about a medical condition than X-rays, CT scans, and ultrasounds can provide.  You may be given earphones to listen for instructions.  You may eat a light breakfast and take medications as ordered with the exception of furosemide, hydrochlorothiazide, or spironolactone(fluid pill, other). Please avoid stimulants for 12 hr prior to test. (Ie. Caffeine, nicotine, chocolate, or antihistamine medications)  An IV will be inserted into one of your veins. Contrast material will be injected into your IV. It will leave your body through your urine within a day. You may be told to drink plenty of fluids to help flush the contrast material out of your system.  You will be asked to remove all metal, including: Watch, jewelry, and other metal objects including hearing aids, hair pieces and dentures. Also wearable glucose monitoring systems (ie. Freestyle Libre and Omnipods) (Braces and fillings normally are not a problem.)   TEST WILL TAKE APPROXIMATELY 1 HOUR  PLEASE NOTIFY SCHEDULING AT LEAST 24 HOURS IN ADVANCE IF YOU ARE UNABLE TO KEEP YOUR APPOINTMENT. [phone]  For more information and frequently asked questions, please visit our website : [email or web address]  Please call the Cardiac Imaging Nurse Navigators with any questions/concerns. [phone] Office  Prior to CT scan of AORTA  Please take the following medications as directed below.  Prednisone 50 mg - take 13 hours prior to test Take another Prednisone 50 mg 7 hours prior to test Take another Prednisone 50 mg 1 hour prior to test Take  Benadryl 50 mg 1 hour prior to test Patient must complete all four doses of above prophylactic medications. Patient will need a ride after test due to Benadryl.

## 2022-12-03 NOTE — Addendum Note (Signed)
Addended by: Alvin Critchley A on: 12/03/2022 04:42 PM   Modules accepted: Orders

## 2022-12-03 NOTE — Telephone Encounter (Signed)
Hello called central scheduling and the scheduler stated that the provider would have to call in a 13 hour prep

## 2022-12-04 LAB — CBC
Hematocrit: 38.3 % (ref 37.5–51.0)
Hemoglobin: 12.6 g/dL — ABNORMAL LOW (ref 13.0–17.7)
MCH: 29.6 pg (ref 26.6–33.0)
MCHC: 32.9 g/dL (ref 31.5–35.7)
MCV: 90 fL (ref 79–97)
Platelets: 194 10*3/uL (ref 150–450)
RBC: 4.25 x10E6/uL (ref 4.14–5.80)
RDW: 12.2 % (ref 11.6–15.4)
WBC: 4.8 10*3/uL (ref 3.4–10.8)

## 2022-12-04 LAB — COMPREHENSIVE METABOLIC PANEL
ALT: 15 IU/L (ref 0–44)
AST: 13 IU/L (ref 0–40)
Albumin: 4.2 g/dL (ref 3.8–4.8)
Alkaline Phosphatase: 66 IU/L (ref 44–121)
BUN/Creatinine Ratio: 13 (ref 10–24)
BUN: 11 mg/dL (ref 8–27)
Bilirubin Total: 0.5 mg/dL (ref 0.0–1.2)
CO2: 22 mmol/L (ref 20–29)
Calcium: 8.8 mg/dL (ref 8.6–10.2)
Chloride: 104 mmol/L (ref 96–106)
Creatinine, Ser: 0.85 mg/dL (ref 0.76–1.27)
Globulin, Total: 3.3 g/dL (ref 1.5–4.5)
Glucose: 83 mg/dL (ref 70–99)
Potassium: 3.8 mmol/L (ref 3.5–5.2)
Sodium: 138 mmol/L (ref 134–144)
Total Protein: 7.5 g/dL (ref 6.0–8.5)
eGFR: 92 mL/min/{1.73_m2} (ref >59)

## 2022-12-04 LAB — LIPID PANEL
Chol/HDL Ratio: 4.9 ratio (ref 0.0–5.0)
Cholesterol, Total: 173 mg/dL (ref 100–199)
HDL: 35 mg/dL — ABNORMAL LOW (ref >39)
LDL Chol Calc (NIH): 114 mg/dL — ABNORMAL HIGH (ref 0–99)
Triglycerides: 131 mg/dL (ref 0–149)
VLDL Cholesterol Cal: 24 mg/dL (ref 5–40)

## 2022-12-04 LAB — LIPOPROTEIN A (LPA): Lipoprotein (a): 64.7 nmol/L (ref <75.0)

## 2022-12-09 ENCOUNTER — Ambulatory Visit (HOSPITAL_COMMUNITY)
Admission: RE | Admit: 2022-12-09 | Discharge: 2022-12-09 | Disposition: A | Discharge status: Home / Self Care | Payer: Medicare Other | Source: Ambulatory Visit | Attending: Internal Medicine | Admitting: Internal Medicine

## 2022-12-09 DIAGNOSIS — Z0000 Encounter for general adult medical examination without abnormal findings: Secondary | ICD-10-CM | POA: Diagnosis not present

## 2022-12-09 DIAGNOSIS — I10 Essential (primary) hypertension: Secondary | ICD-10-CM | POA: Insufficient documentation

## 2022-12-09 DIAGNOSIS — I7121 Aneurysm of the ascending aorta, without rupture: Secondary | ICD-10-CM | POA: Diagnosis not present

## 2022-12-09 DIAGNOSIS — I517 Cardiomegaly: Secondary | ICD-10-CM | POA: Insufficient documentation

## 2022-12-09 DIAGNOSIS — Z6836 Body mass index (BMI) 36.0-36.9, adult: Secondary | ICD-10-CM | POA: Diagnosis not present

## 2022-12-09 DIAGNOSIS — R222 Localized swelling, mass and lump, trunk: Secondary | ICD-10-CM | POA: Diagnosis not present

## 2022-12-09 DIAGNOSIS — I77819 Aortic ectasia, unspecified site: Secondary | ICD-10-CM | POA: Diagnosis not present

## 2022-12-09 MED ORDER — IOHEXOL 350 MG/ML SOLN
75.0000 mL | Freq: Once | INTRAVENOUS | Status: AC | PRN
  Administered 2022-12-09: 75 mL via INTRAVENOUS

## 2022-12-11 IMAGING — DX DG ABDOMEN 1V
2 series · 2 of 2 positions shown · non-contrast
Comparison: KUB 05/10/2021 and earlier.

CLINICAL DATA: 70 year old male with planned lithotripsy, right
sided stone.

EXAM:
ABDOMEN - 1 VIEW

[abdomen kub (1 of 2)]
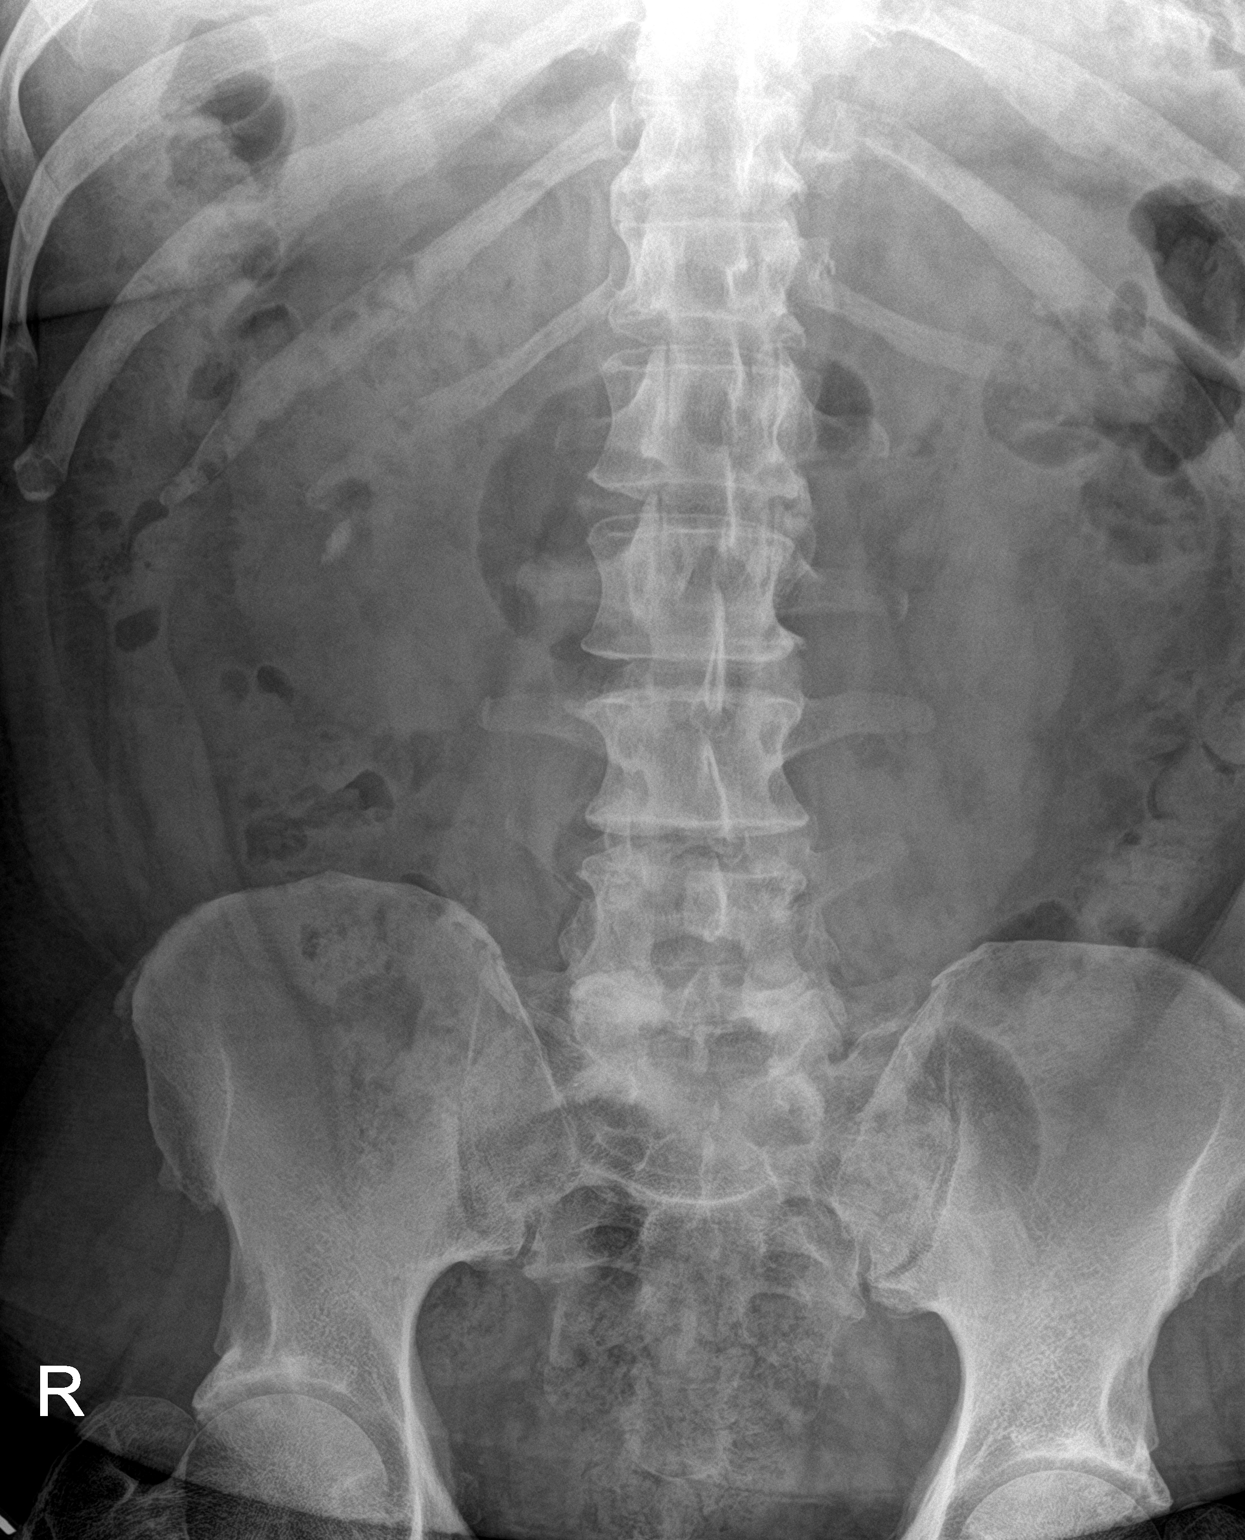

[abdomen kub (2 of 2)]
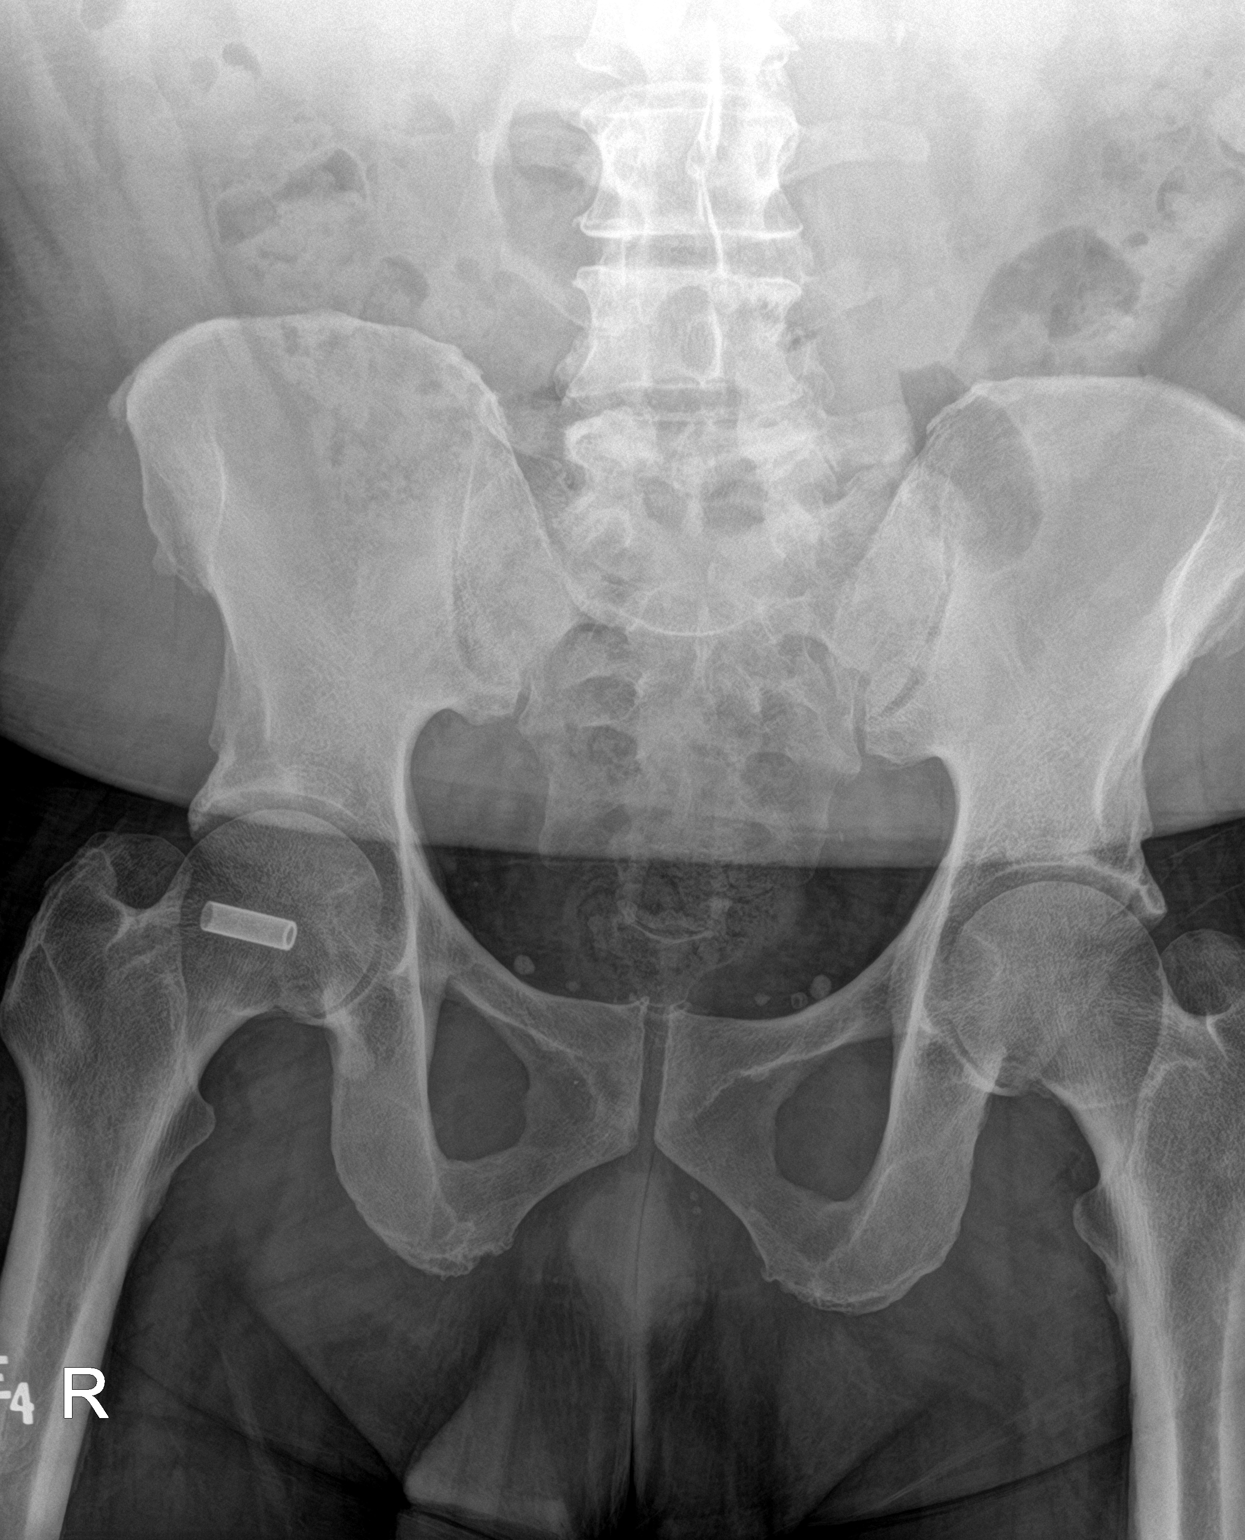

[2 of 2 positions shown; findings below may reference images not displayed]

FINDINGS: Oblong roughly 13 mm calcification projecting over the right renal
shadow is stable from earlier this month. Chronic pelvic
phleboliths. No other urinary calculus identified radiographically.
Non obstructed bowel gas pattern. No acute osseous abnormality
identified. Cylindrical object projecting over the right femoral
head may be external artifact, uncertain.
IMPRESSION: 1. A 13 mm right renal calculus is stable from earlier this month.
2. No other urinary calculus identified radiographically.

## 2022-12-24 DIAGNOSIS — R7303 Prediabetes: Secondary | ICD-10-CM | POA: Diagnosis not present

## 2022-12-24 DIAGNOSIS — K219 Gastro-esophageal reflux disease without esophagitis: Secondary | ICD-10-CM | POA: Diagnosis not present

## 2022-12-24 DIAGNOSIS — I10 Essential (primary) hypertension: Secondary | ICD-10-CM | POA: Diagnosis not present

## 2022-12-24 DIAGNOSIS — R634 Abnormal weight loss: Secondary | ICD-10-CM | POA: Diagnosis not present

## 2022-12-24 DIAGNOSIS — E200 Idiopathic hypoparathyroidism: Secondary | ICD-10-CM | POA: Diagnosis not present

## 2022-12-31 DIAGNOSIS — E200 Idiopathic hypoparathyroidism: Secondary | ICD-10-CM | POA: Diagnosis not present

## 2022-12-31 DIAGNOSIS — R634 Abnormal weight loss: Secondary | ICD-10-CM | POA: Diagnosis not present

## 2023-02-13 ENCOUNTER — Encounter (HOSPITAL_COMMUNITY): Payer: Self-pay

## 2023-02-18 ENCOUNTER — Other Ambulatory Visit: Payer: Self-pay | Admitting: Internal Medicine

## 2023-02-18 ENCOUNTER — Ambulatory Visit (HOSPITAL_COMMUNITY)
Admission: RE | Admit: 2023-02-18 | Discharge: 2023-02-18 | Disposition: A | Discharge status: Home / Self Care | Payer: Medicare Other | Source: Ambulatory Visit | Attending: Internal Medicine | Admitting: Internal Medicine

## 2023-02-18 DIAGNOSIS — Z0000 Encounter for general adult medical examination without abnormal findings: Secondary | ICD-10-CM

## 2023-02-18 DIAGNOSIS — I77819 Aortic ectasia, unspecified site: Secondary | ICD-10-CM

## 2023-02-18 DIAGNOSIS — I10 Essential (primary) hypertension: Secondary | ICD-10-CM | POA: Diagnosis present

## 2023-02-18 DIAGNOSIS — Z6836 Body mass index (BMI) 36.0-36.9, adult: Secondary | ICD-10-CM | POA: Diagnosis present

## 2023-02-18 DIAGNOSIS — I517 Cardiomegaly: Secondary | ICD-10-CM | POA: Diagnosis not present

## 2023-02-18 MED ORDER — GADOBUTROL 1 MMOL/ML IV SOLN
10.0000 mL | Freq: Once | INTRAVENOUS | Status: AC | PRN
  Administered 2023-02-18: 10 mL via INTRAVENOUS

## 2023-02-23 DIAGNOSIS — Z23 Encounter for immunization: Secondary | ICD-10-CM | POA: Diagnosis not present

## 2023-03-12 DIAGNOSIS — R21 Rash and other nonspecific skin eruption: Secondary | ICD-10-CM | POA: Diagnosis not present

## 2023-03-12 DIAGNOSIS — R5383 Other fatigue: Secondary | ICD-10-CM | POA: Diagnosis not present

## 2023-03-12 DIAGNOSIS — E669 Obesity, unspecified: Secondary | ICD-10-CM | POA: Diagnosis not present

## 2023-03-12 DIAGNOSIS — R700 Elevated erythrocyte sedimentation rate: Secondary | ICD-10-CM | POA: Diagnosis not present

## 2023-03-12 DIAGNOSIS — Z6833 Body mass index (BMI) 33.0-33.9, adult: Secondary | ICD-10-CM | POA: Diagnosis not present

## 2023-03-12 DIAGNOSIS — R768 Other specified abnormal immunological findings in serum: Secondary | ICD-10-CM | POA: Diagnosis not present

## 2023-03-17 DIAGNOSIS — E200 Idiopathic hypoparathyroidism: Secondary | ICD-10-CM | POA: Diagnosis not present

## 2023-03-17 DIAGNOSIS — K219 Gastro-esophageal reflux disease without esophagitis: Secondary | ICD-10-CM | POA: Diagnosis not present

## 2023-03-17 DIAGNOSIS — I10 Essential (primary) hypertension: Secondary | ICD-10-CM | POA: Diagnosis not present

## 2023-03-17 DIAGNOSIS — R7303 Prediabetes: Secondary | ICD-10-CM | POA: Diagnosis not present

## 2023-03-17 DIAGNOSIS — E559 Vitamin D deficiency, unspecified: Secondary | ICD-10-CM | POA: Diagnosis not present

## 2023-04-06 DIAGNOSIS — H401112 Primary open-angle glaucoma, right eye, moderate stage: Secondary | ICD-10-CM | POA: Diagnosis not present

## 2023-04-06 DIAGNOSIS — H25812 Combined forms of age-related cataract, left eye: Secondary | ICD-10-CM | POA: Diagnosis not present

## 2023-04-06 DIAGNOSIS — H25811 Combined forms of age-related cataract, right eye: Secondary | ICD-10-CM | POA: Diagnosis not present

## 2023-04-06 DIAGNOSIS — E119 Type 2 diabetes mellitus without complications: Secondary | ICD-10-CM | POA: Diagnosis not present

## 2023-04-06 DIAGNOSIS — Z0100 Encounter for examination of eyes and vision without abnormal findings: Secondary | ICD-10-CM | POA: Diagnosis not present

## 2023-04-06 DIAGNOSIS — H401123 Primary open-angle glaucoma, left eye, severe stage: Secondary | ICD-10-CM | POA: Diagnosis not present

## 2023-07-05 NOTE — Progress Notes (Unsigned)
 Cardiology Office Note:   Date:  07/10/2023  ID:  Mitchell Pennington, DOB 1950-08-16, MRN [id number] PCP:  Daisy Floro, MD  [facility] Providers Cardiologist:  Alverda Skeans, MD Referring MD: Daisy Floro, MD  Chief Complaint/Reason for Referral: Follow-up for left ventricular hypertrophy ASSESSMENT:    1. Diastolic dysfunction   2. Aortic dilatation (HCC)   3. Primary hypertension   4. Body mass index (BMI) of 36.0 to 36.9   5. Swelling   6. Prediabetes     PLAN:   In order of problems listed above: Diastolic dysfunction.  Cardiac MRI was not consistent with hypertrophic obstructive cardiomyopathy.  Patient intolerant of ACE inhibitors or ARB's.  Will start Jardiance 10 mg daily.   Aortic dilatation: Aortic dilatation has been stable for the last 2 years.  Will get MRA in November 2026. Hypertension: Increase amlodipine to 5 mg and continue atenolol 50 mg.  If the patient develops heart failure symptoms would start spironolactone. Elevated BMI: Diet and exercise; will check hemoglobin A1c to screen for diabetes. Swelling: Has varicosities of right lower extremity.  I suggested compression therapy.            Dispo:  Return in about 1 year (around 07/09/2024).      Medication Adjustments/Labs and Tests Ordered: Current medicines are reviewed at length with the patient today.  Concerns regarding medicines are outlined above.  The following changes have been made:     Labs/tests ordered: Orders Placed This Encounter  Procedures   MR ANGIO CHEST W WO CONTRAST   Hemoglobin A1c    Medication Changes: Meds ordered this encounter  Medications   amLODipine (NORVASC) 5 MG tablet    Sig: Take 1 tablet (5 mg total) by mouth daily.    Dispense:  90 tablet    Refill:  3    Dose change (increased on 4/425)   empagliflozin (JARDIANCE) 10 MG TABS tablet    Sig: Take 1 tablet (10 mg total) by mouth daily before breakfast.    Dispense:  90 tablet    Refill:  3    empagliflozin (JARDIANCE) 10 MG TABS tablet    Sig: Take 1 tablet (10 mg total) by mouth daily before breakfast.    Dispense:  28 tablet    Lot Number?:   [id number]    Expiration Date?:   04/06/2025    Current medicines are reviewed at length with the patient today.  The patient does not have concerns regarding medicines.  I spent 35 minutes reviewing all clinical data during and prior to this visit including all relevant imaging studies, laboratories, clinical information from other health systems and prior notes from both Cardiology and other specialties, interviewing the patient, conducting a complete physical examination, and coordinating care in order to formulate a comprehensive and personalized evaluation and treatment plan.   History of Present Illness:      FOCUSED PROBLEM LIST:   Hypertension ACE and ARB induced angioedema Aortic dilatation 41 mm TTE November 2023 41 mm CMR November 2024 Diastolic dysfunction Asymmetric left ventricular hypertrophy, G1 DD, EF 60 to 65% TTE 2023 Moderate asymmetric hypertrophy not consistent with HOCM, CMR 2024 GERD BMI 36  Oct 2023:  The patient is a 73 y.o. male with the indicated medical history here for for an incidentally noted murmur.  The patient was seen by his primary care provider recently.  A murmur was noted.  For this reason he was referred for cardiology consultation.  The patient has  been doing relatively well.  He is able to do all of his activities of daily living without any issues.  At night he sometimes gets a chest pain that is exacerbated by chest wall motion.  He does not get this chest pain when he exerts himself.  He denies any significant shortness of breath, presyncope, syncope, palpitations, paroxysmal nocturnal dyspnea, orthopnea.  He is otherwise well without significant complaints.  Plan: Obtain echocardiogram.   August 2024: In the interim the patient had an echocardiogram which demonstrated mild asymmetric left  ventricular hypertrophy of the basal septal segment.  His ejection fraction was normal.  He also had dilatation of the ascending aorta.  He underwent uncomplicated lithotripsy of a left ureteral stone in May of this year.  The patient is doing well.  He denies any significant shortness of breath or exertional angina.  He has noticed some mild right lower extremity edema which is chronic.  His biggest issue today is right flank pain and he had a CT scan done yesterday which has not yet been read for this issue.  He denies any presyncope or syncope, he has had no palpitations.  He has required no emergency room visits or unplanned hospitalizations for any issues.  He otherwise feels well.  Plan: Obtain for cardiac MRI to evaluate for hypertrophic obstructive cardiomyopathy.  Check lipid panel CMP and LP(a).  April 2025:  Patient consents to use of AI scribe. In the interim the patient's LDL was above 100.  His LP(a) was within normal limits.  CMR was not consistent with hypertrophic obstructive cardiomyopathy.  Aortic dimensions on that study were 41 mm.  He does not regularly check his blood pressure at home, although his wife has a monitor.  He experiences numbness and pain in his leg, particularly at night, which he describes as 'restless' and uncomfortable. He has varicose veins on the affected side, which he believes contributes to the numbness and swelling.   No chest pain, shortness of breath, presyncope, syncope, or emergency room visits.         Current Medications: Current Meds  Medication Sig   amLODipine (NORVASC) 5 MG tablet Take 1 tablet (5 mg total) by mouth daily.   Ascorbic Acid (VITAMIN C) 1000 MG tablet Take 1,000 mg by mouth daily.   aspirin-acetaminophen-caffeine (EXCEDRIN MIGRAINE) 250-250-65 MG tablet Take 1 tablet by mouth every 6 (six) hours as needed for headache.   atenolol (TENORMIN) 50 MG tablet Take 50 mg by mouth daily.   diphenhydrAMINE (BENADRYL) 50 MG capsule Take  one capsule 1 hour prior to scan.   diphenhydramine-acetaminophen (TYLENOL PM) 25-500 MG TABS tablet Take 1 tablet by mouth every 6 (six) hours as needed (hives/itching).   docusate sodium (COLACE) 100 MG capsule Take 1 capsule (100 mg total) by mouth daily as needed for up to 30 doses.   dorzolamide-timolol (COSOPT) 2-0.5 % ophthalmic solution SMARTSIG:In Eye(s)   empagliflozin (JARDIANCE) 10 MG TABS tablet Take 1 tablet (10 mg total) by mouth daily before breakfast.   empagliflozin (JARDIANCE) 10 MG TABS tablet Take 1 tablet (10 mg total) by mouth daily before breakfast.   Ergocalciferol 50 MCG (2000 UT) TABS 1 tablet Orally Once a day   fexofenadine (ALLEGRA) 180 MG tablet Take 180 mg by mouth as needed for allergies.   hydrocortisone cream 1 % Apply 1 application topically See admin instructions. Apply topically after shaving - approximately 3 times week   latanoprost (XALATAN) 0.005 % ophthalmic solution Place 1 drop  into both eyes at bedtime.   loratadine (CLARITIN) 10 MG tablet Take 10 mg by mouth daily as needed for allergies.   montelukast (SINGULAIR) 10 MG tablet Take 10 mg by mouth daily as needed (seasonal allergies).   neomycin-bacitracin-polymyxin (NEOSPORIN) OINT Apply 1 application topically See admin instructions. Apply topically after shaving - approximately three times week   pantoprazole (PROTONIX) 40 MG tablet Take 40 mg by mouth daily.   predniSONE (DELTASONE) 50 MG tablet Take 1 tablet (50 mg total) by mouth as directed. Take one tablet 13 hours prior to CT scan, one tablet 7 hours prior to CT scan and one tablet 1 hour prior to CT scan.   tadalafil (CIALIS) 5 MG tablet Take 5 mg by mouth at bedtime.   triamcinolone cream (KENALOG) 0.1 % Apply 1 application topically 2 (two) times daily as needed (rash/itching).    Vitamin E 100 units TABS Take 1 tablet by mouth daily. Patient not taking   [DISCONTINUED] amLODipine (NORVASC) 2.5 MG tablet Take 2.5 mg by mouth daily.      Review of Systems:   Please see the history of present illness.    All other systems reviewed and are negative.     EKGs/Labs/Other Test Reviewed:   EKG: 2024 EKG demonstrates normal sinus rhythm with LVH criteria  EKG Interpretation Date/Time:    Ventricular Rate:    PR Interval:    QRS Duration:    QT Interval:    QTC Calculation:   R Axis:      Text Interpretation:           Risk Assessment/Calculations:          Physical Exam:   VS:  BP 132/88 (BP Location: Right Arm, Patient Position: Sitting, Cuff Size: Normal)   Pulse 62   Ht 5\' 11"  (1.803 m)   Wt 240 lb (108.9 kg)   SpO2 99%   BMI 33.47 kg/m        Wt Readings from Last 3 Encounters:  07/10/23 240 lb (108.9 kg)  12/03/22 247 lb (112 kg)  08/18/22 242 lb 3.2 oz (109.9 kg)      GENERAL:  No apparent distress, AOx3 HEENT:  No carotid bruits, +2 carotid impulses, no scleral icterus CAR: RRR no murmurs, gallops, rubs, or thrills RES:  Clear to auscultation bilaterally ABD:  Soft, nontender, nondistended, positive bowel sounds x 4 VASC:  +2 radial pulses, +2 carotid pulses NEURO:  CN 2-12 grossly intact; motor and sensory grossly intact PSYCH:  No active depression or anxiety EXT:  No edema, ecchymosis, or cyanosis; right lower extremity varicosities  Signed, Orbie Pyo, MD  07/10/2023 10:27 AM    [facility] [address] [place], [place], [place]  [address] Phone: [phone]; Fax: [phone]   Note:  This document was prepared using Dragon voice recognition software and may include unintentional dictation errors.

## 2023-07-10 ENCOUNTER — Ambulatory Visit: Attending: Internal Medicine | Admitting: Internal Medicine

## 2023-07-10 ENCOUNTER — Encounter: Payer: Self-pay | Admitting: Internal Medicine

## 2023-07-10 VITALS — BP 132/88 | HR 62 | Ht 71.0 in | Wt 240.0 lb

## 2023-07-10 DIAGNOSIS — I10 Essential (primary) hypertension: Secondary | ICD-10-CM | POA: Insufficient documentation

## 2023-07-10 DIAGNOSIS — Z6836 Body mass index (BMI) 36.0-36.9, adult: Secondary | ICD-10-CM | POA: Insufficient documentation

## 2023-07-10 DIAGNOSIS — I77819 Aortic ectasia, unspecified site: Secondary | ICD-10-CM | POA: Diagnosis not present

## 2023-07-10 DIAGNOSIS — I5189 Other ill-defined heart diseases: Secondary | ICD-10-CM | POA: Diagnosis not present

## 2023-07-10 DIAGNOSIS — R7303 Prediabetes: Secondary | ICD-10-CM | POA: Diagnosis not present

## 2023-07-10 DIAGNOSIS — R609 Edema, unspecified: Secondary | ICD-10-CM | POA: Diagnosis not present

## 2023-07-10 MED ORDER — EMPAGLIFLOZIN 10 MG PO TABS: 10.0000 mg | ORAL_TABLET | Freq: Every day | ORAL | Status: AC

## 2023-07-10 MED ORDER — AMLODIPINE BESYLATE 5 MG PO TABS: 5.0000 mg | ORAL_TABLET | Freq: Every day | ORAL | 3 refills | Status: AC

## 2023-07-10 MED ORDER — EMPAGLIFLOZIN 10 MG PO TABS
10.0000 mg | ORAL_TABLET | Freq: Every day | ORAL | 3 refills | Status: AC
Start: 2023-07-10 — End: ?

## 2023-07-10 NOTE — Patient Instructions (Addendum)
 Medication Instructions:  Your physician has recommended you make the following change in your medication:   1) START empagliflozin (Jardiance) 10 mg daily before breakfast 2) INCREASE amlodipine to 5 mg daily  *If you need a refill on your cardiac medications before your next appointment, please call your pharmacy*  Lab Work: TODAY: hemoglobin A1c If you have labs (blood work) drawn today and your tests are completely normal, you will receive your results only by: MyChart Message (if you have MyChart) OR A paper copy in the mail If you have any lab test that is abnormal or we need to change your treatment, we will call you to review the results.  Testing/Procedures: Your physician has requested that you have a cardiac MRA in November 2026. Cardiac MRA uses a computer to create images of your heart as its beating, producing both still and moving pictures of your heart and major blood vessels. For further information please visit [email or web address]. Please follow the instruction sheet given to you today for more information.   Follow-Up: At [facility], you and your health needs are our priority.  As part of our continuing mission to provide you with exceptional heart care, our providers are all part of one team.  This team includes your primary Cardiologist (physician) and Advanced Practice Providers or APPs (Physician Assistants and Nurse Practitioners) who all work together to provide you with the care you need, when you need it.  Your next appointment:   1 year(s)  Provider:   Jari Favre, PA-C, Ronie Spies, PA-C, Robin Searing, NP, or Tereso Newcomer, PA-C  We recommend signing up for the patient portal called "MyChart".  Sign up information is provided on this After Visit Summary.  MyChart is used to connect with patients for Virtual Visits (Telemedicine).  Patients are able to view lab/test results, encounter notes, upcoming appointments, etc.  Non-urgent messages can be sent to  your provider as well.   To learn more about what you can do with MyChart, go to [email or web address].   Other Instructions      1st Floor: - Lobby - Registration  - Pharmacy  - Lab - Cafe  2nd Floor: - PV Lab - Diagnostic Testing (echo, CT, nuclear med)  3rd Floor: - Vacant  4th Floor: - TCTS (cardiothoracic surgery) - AFib Clinic - Structural Heart Clinic - Vascular Surgery  - Vascular Ultrasound  5th Floor: - HeartCare Cardiology (general and EP) - Clinical Pharmacy for coumadin, hypertension, lipid, weight-loss medications, and med management appointments    Valet parking services will be available as well.

## 2023-07-11 ENCOUNTER — Encounter: Payer: Self-pay | Admitting: Internal Medicine

## 2023-07-11 LAB — HEMOGLOBIN A1C
Est. average glucose Bld gHb Est-mCnc: 137 mg/dL
Hgb A1c MFr Bld: 6.4 % — ABNORMAL HIGH (ref 4.8–5.6)

## 2023-09-10 DIAGNOSIS — R21 Rash and other nonspecific skin eruption: Secondary | ICD-10-CM | POA: Diagnosis not present

## 2023-09-10 DIAGNOSIS — E669 Obesity, unspecified: Secondary | ICD-10-CM | POA: Diagnosis not present

## 2023-09-10 DIAGNOSIS — R5383 Other fatigue: Secondary | ICD-10-CM | POA: Diagnosis not present

## 2023-09-10 DIAGNOSIS — R700 Elevated erythrocyte sedimentation rate: Secondary | ICD-10-CM | POA: Diagnosis not present

## 2023-09-10 DIAGNOSIS — Z6833 Body mass index (BMI) 33.0-33.9, adult: Secondary | ICD-10-CM | POA: Diagnosis not present

## 2023-09-10 DIAGNOSIS — R768 Other specified abnormal immunological findings in serum: Secondary | ICD-10-CM | POA: Diagnosis not present

## 2023-10-12 DIAGNOSIS — H401112 Primary open-angle glaucoma, right eye, moderate stage: Secondary | ICD-10-CM | POA: Diagnosis not present

## 2023-10-12 DIAGNOSIS — H25811 Combined forms of age-related cataract, right eye: Secondary | ICD-10-CM | POA: Diagnosis not present

## 2023-10-12 DIAGNOSIS — H25812 Combined forms of age-related cataract, left eye: Secondary | ICD-10-CM | POA: Diagnosis not present

## 2023-10-12 DIAGNOSIS — H401123 Primary open-angle glaucoma, left eye, severe stage: Secondary | ICD-10-CM | POA: Diagnosis not present

## 2023-11-04 DIAGNOSIS — H25811 Combined forms of age-related cataract, right eye: Secondary | ICD-10-CM | POA: Diagnosis not present

## 2023-11-04 DIAGNOSIS — H401112 Primary open-angle glaucoma, right eye, moderate stage: Secondary | ICD-10-CM | POA: Diagnosis not present

## 2023-11-04 DIAGNOSIS — H401123 Primary open-angle glaucoma, left eye, severe stage: Secondary | ICD-10-CM | POA: Diagnosis not present

## 2023-11-04 DIAGNOSIS — H25812 Combined forms of age-related cataract, left eye: Secondary | ICD-10-CM | POA: Diagnosis not present

## 2023-11-12 DIAGNOSIS — N200 Calculus of kidney: Secondary | ICD-10-CM | POA: Diagnosis not present

## 2023-11-12 DIAGNOSIS — N401 Enlarged prostate with lower urinary tract symptoms: Secondary | ICD-10-CM | POA: Diagnosis not present

## 2023-11-12 DIAGNOSIS — R351 Nocturia: Secondary | ICD-10-CM | POA: Diagnosis not present

## 2023-11-23 DIAGNOSIS — Z125 Encounter for screening for malignant neoplasm of prostate: Secondary | ICD-10-CM | POA: Diagnosis not present

## 2023-11-23 DIAGNOSIS — I10 Essential (primary) hypertension: Secondary | ICD-10-CM | POA: Diagnosis not present

## 2023-11-23 DIAGNOSIS — E7800 Pure hypercholesterolemia, unspecified: Secondary | ICD-10-CM | POA: Diagnosis not present

## 2023-11-23 DIAGNOSIS — R7303 Prediabetes: Secondary | ICD-10-CM | POA: Diagnosis not present

## 2023-11-26 DIAGNOSIS — R7303 Prediabetes: Secondary | ICD-10-CM | POA: Diagnosis not present

## 2023-11-26 DIAGNOSIS — E7800 Pure hypercholesterolemia, unspecified: Secondary | ICD-10-CM | POA: Diagnosis not present

## 2023-11-26 DIAGNOSIS — Z0000 Encounter for general adult medical examination without abnormal findings: Secondary | ICD-10-CM | POA: Diagnosis not present

## 2023-11-26 DIAGNOSIS — Z6833 Body mass index (BMI) 33.0-33.9, adult: Secondary | ICD-10-CM | POA: Diagnosis not present

## 2023-11-26 DIAGNOSIS — K219 Gastro-esophageal reflux disease without esophagitis: Secondary | ICD-10-CM | POA: Diagnosis not present

## 2023-11-26 DIAGNOSIS — B360 Pityriasis versicolor: Secondary | ICD-10-CM | POA: Diagnosis not present

## 2023-11-26 DIAGNOSIS — J309 Allergic rhinitis, unspecified: Secondary | ICD-10-CM | POA: Diagnosis not present

## 2023-11-26 DIAGNOSIS — I10 Essential (primary) hypertension: Secondary | ICD-10-CM | POA: Diagnosis not present

## 2024-02-05 ENCOUNTER — Other Ambulatory Visit: Payer: Self-pay | Admitting: Internal Medicine

## 2024-03-23 ENCOUNTER — Encounter: Admitting: Nurse Practitioner

## 2024-03-23 NOTE — Progress Notes (Unsigned)
 New Patient Office Visit  Subjective    Patient ID: Mitchell Pennington, male    DOB: May 02, 1950  Age: 73 y.o. MRN: [id number]  CC:  Chief Complaint  Patient presents with   Establish Care    HPI Mitchell Pennington presents to establish care  HTN: Patient currently followed by Dr. Wendel to cardiology.  Patient currently maintained on amlodipine  5 mg daily, atenolol  50 mg daily  Glaucoma: Patient currently maintained on eyedrops and followed by [facility].  Dr. Dempsey Pardon  Prediabetes: Patient currently maintained on Jardiance  for diastolic dysfunction.  GERD: Currently maintained on Protonix 40 mg daily  Allergies: Currently maintained on Singulair  10 mg daily and Claritin 10 mg daily.  Allegra?  BPH: Currently maintained on tadalafil 5 mg at bedtime  Colonoscopy: PSA:  Tdap: 2015 Flu:? PNA: 2017, 2018 Shingles:? Outpatient Encounter Medications as of 03/23/2024  Medication Sig   amLODipine  (NORVASC ) 5 MG tablet Take 1 tablet (5 mg total) by mouth daily.   Ascorbic Acid (VITAMIN C) 1000 MG tablet Take 1,000 mg by mouth daily.   aspirin-acetaminophen -caffeine (EXCEDRIN MIGRAINE) 250-250-65 MG tablet Take 1 tablet by mouth every 6 (six) hours as needed for headache.   atenolol  (TENORMIN ) 50 MG tablet Take 50 mg by mouth daily.   diphenhydrAMINE  (BENADRYL ) 50 MG capsule Take one capsule 1 hour prior to scan.   diphenhydramine -acetaminophen  (TYLENOL  PM) 25-500 MG TABS tablet Take 1 tablet by mouth every 6 (six) hours as needed (hives/itching).   docusate sodium  (COLACE) 100 MG capsule Take 1 capsule (100 mg total) by mouth daily as needed for up to 30 doses.   dorzolamide-timolol (COSOPT) 2-0.5 % ophthalmic solution SMARTSIG:In Eye(s)   empagliflozin  (JARDIANCE ) 10 MG TABS tablet Take 1 tablet (10 mg total) by mouth daily before breakfast.   empagliflozin  (JARDIANCE ) 10 MG TABS tablet Take 1 tablet (10 mg total) by mouth daily before breakfast.    Ergocalciferol 50 MCG (2000 UT) TABS 1 tablet Orally Once a day   fexofenadine (ALLEGRA) 180 MG tablet Take 180 mg by mouth as needed for allergies.   hydrocortisone cream 1 % Apply 1 application topically See admin instructions. Apply topically after shaving - approximately 3 times week   latanoprost (XALATAN) 0.005 % ophthalmic solution Place 1 drop into both eyes at bedtime.   loratadine (CLARITIN) 10 MG tablet Take 10 mg by mouth daily as needed for allergies.   montelukast  (SINGULAIR ) 10 MG tablet Take 10 mg by mouth daily as needed (seasonal allergies).   neomycin-bacitracin-polymyxin (NEOSPORIN) OINT Apply 1 application topically See admin instructions. Apply topically after shaving - approximately three times week   pantoprazole (PROTONIX) 40 MG tablet Take 40 mg by mouth daily.   predniSONE  (DELTASONE ) 50 MG tablet Take 1 tablet (50 mg total) by mouth as directed. Take one tablet 13 hours prior to CT scan, one tablet 7 hours prior to CT scan and one tablet 1 hour prior to CT scan.   tadalafil (CIALIS) 5 MG tablet Take 5 mg by mouth at bedtime.   triamcinolone cream (KENALOG) 0.1 % Apply 1 application topically 2 (two) times daily as needed (rash/itching).    Vitamin E 100 units TABS Take 1 tablet by mouth daily. Patient not taking   No facility-administered encounter medications on file as of 03/23/2024.    Past Medical History:  Diagnosis Date   Allergic rhinitis    Angio-edema    BMI 36.0-36.9,adult    GERD (gastroesophageal reflux disease)    History  of colon polyps    History of kidney stones    Hypertension    Murmur    Obesity    Rash    Unspecified disturbances of smell and taste    Urticaria     Past Surgical History:  Procedure Laterality Date   EXTRACORPOREAL SHOCK WAVE LITHOTRIPSY Right 05/27/2021   Procedure: EXTRACORPOREAL SHOCK WAVE LITHOTRIPSY (ESWL);  Surgeon: Alvaro Hummer, MD;  Location: [facility];  Service: Urology;  Laterality: Right;    EXTRACORPOREAL SHOCK WAVE LITHOTRIPSY Left 08/18/2022   Procedure: LEFT EXTRACORPOREAL SHOCK WAVE LITHOTRIPSY (ESWL);  Surgeon: Selma Donnice SAUNDERS, MD;  Location: [facility];  Service: Urology;  Laterality: Left;    No family history on file.  Social History   Socioeconomic History   Marital status: Married    Spouse name: Not on file   Number of children: Not on file   Years of education: Not on file   Highest education level: Not on file  Occupational History   Not on file  Tobacco Use   Smoking status: Never   Smokeless tobacco: Never  Vaping Use   Vaping status: Never Used  Substance and Sexual Activity   Alcohol use: No   Drug use: No   Sexual activity: Never  Other Topics Concern   Not on file  Social History Narrative   Not on file   Social Drivers of Health   Tobacco Use: Low Risk  (11/04/2023)   Received from [facility]   Patient History    Smoking Tobacco Use: Never    Smokeless Tobacco Use: Never    Passive Exposure: Not on file  Financial Resource Strain: Not on file  Food Insecurity: Not on file  Transportation Needs: Not on file  Physical Activity: Not on file  Stress: Not on file  Social Connections: Not on file  Intimate Partner Violence: Not on file  Depression (EYV7-0): Not on file  Alcohol Screen: Not on file  Housing: Unknown (10/12/2023)   Received from [facility]   Epic    Unable to Pay for Housing in the Last Year: Not on file    Number of Times Moved in the Last Year: Not on file    At any time in the past 12 months, were you homeless or living in a shelter (including now)?: No  Utilities: Not on file  Health Literacy: Not on file    ROS      Objective    Ht 5' 10 (1.778 m)   Wt 236 lb 9.6 oz (107.3 kg)   BMI 33.95 kg/m   Physical Exam  {Labs (Optional):23779}    Assessment & Plan:   Problem List Items Addressed This Visit   None   No follow-ups on file.   Adina Crandall, NP

## 2024-05-27 ENCOUNTER — Ambulatory Visit
